# Patient Record
Sex: Female | Born: 1955 | Race: Black or African American | Hispanic: No | Marital: Married | Smoking: Current every day smoker
Health system: Southern US, Community
[De-identification: ages and names within clinical notes are randomized; demographics above are authoritative.]

## PROBLEM LIST (undated history)

## (undated) DIAGNOSIS — F32A Depression, unspecified: Secondary | ICD-10-CM

## (undated) DIAGNOSIS — K219 Gastro-esophageal reflux disease without esophagitis: Secondary | ICD-10-CM

## (undated) DIAGNOSIS — I10 Essential (primary) hypertension: Secondary | ICD-10-CM

## (undated) DIAGNOSIS — M1990 Unspecified osteoarthritis, unspecified site: Secondary | ICD-10-CM

## (undated) DIAGNOSIS — F419 Anxiety disorder, unspecified: Secondary | ICD-10-CM

## (undated) DIAGNOSIS — Z9109 Other allergy status, other than to drugs and biological substances: Secondary | ICD-10-CM

## (undated) DIAGNOSIS — M797 Fibromyalgia: Secondary | ICD-10-CM

## (undated) DIAGNOSIS — M792 Neuralgia and neuritis, unspecified: Secondary | ICD-10-CM

## (undated) DIAGNOSIS — E039 Hypothyroidism, unspecified: Secondary | ICD-10-CM

## (undated) DIAGNOSIS — K449 Diaphragmatic hernia without obstruction or gangrene: Secondary | ICD-10-CM

## (undated) DIAGNOSIS — F329 Major depressive disorder, single episode, unspecified: Secondary | ICD-10-CM

## (undated) DIAGNOSIS — M7918 Myalgia, other site: Secondary | ICD-10-CM

## (undated) DIAGNOSIS — Z87891 Personal history of nicotine dependence: Secondary | ICD-10-CM

## (undated) DIAGNOSIS — Z889 Allergy status to unspecified drugs, medicaments and biological substances status: Secondary | ICD-10-CM

## (undated) DIAGNOSIS — M549 Dorsalgia, unspecified: Secondary | ICD-10-CM

## (undated) DIAGNOSIS — G8929 Other chronic pain: Secondary | ICD-10-CM

## (undated) DIAGNOSIS — K589 Irritable bowel syndrome without diarrhea: Secondary | ICD-10-CM

## (undated) HISTORY — DX: Hypothyroidism, unspecified: E03.9

## (undated) HISTORY — PX: ANKLE SURGERY: SHX546

## (undated) HISTORY — PX: CHOLECYSTECTOMY: SHX55

## (undated) HISTORY — DX: Anxiety disorder, unspecified: F41.9

## (undated) HISTORY — DX: Gastro-esophageal reflux disease without esophagitis: K21.9

## (undated) HISTORY — DX: Diaphragmatic hernia without obstruction or gangrene: K44.9

## (undated) HISTORY — PX: COLONOSCOPY: SHX174

## (undated) HISTORY — DX: Depression, unspecified: F32.A

## (undated) HISTORY — DX: Fibromyalgia: M79.7

## (undated) HISTORY — PX: EYE SURGERY: SHX253

## (undated) HISTORY — PX: HERNIA REPAIR: SHX51

## (undated) HISTORY — DX: Myalgia, other site: M79.18

## (undated) HISTORY — DX: Personal history of nicotine dependence: Z87.891

## (undated) HISTORY — PX: NECK SURGERY: SHX720

## (undated) HISTORY — DX: Irritable bowel syndrome, unspecified: K58.9

## (undated) HISTORY — DX: Unspecified osteoarthritis, unspecified site: M19.90

## (undated) HISTORY — DX: Major depressive disorder, single episode, unspecified: F32.9

## (undated) HISTORY — PX: BACK SURGERY: SHX140

## (undated) HISTORY — DX: Neuralgia and neuritis, unspecified: M79.2

## (undated) HISTORY — PX: ABDOMINAL HYSTERECTOMY: SHX81

---

## 1997-12-08 ENCOUNTER — Ambulatory Visit (HOSPITAL_COMMUNITY): Admission: RE | Admit: 1997-12-08 | Discharge: 1997-12-08 | Payer: Self-pay | Admitting: Neurosurgery

## 1997-12-13 ENCOUNTER — Ambulatory Visit (HOSPITAL_COMMUNITY): Admission: RE | Admit: 1997-12-13 | Discharge: 1997-12-13 | Payer: Self-pay | Admitting: Neurosurgery

## 2001-06-08 ENCOUNTER — Ambulatory Visit (HOSPITAL_COMMUNITY): Admission: RE | Admit: 2001-06-08 | Discharge: 2001-06-08 | Payer: Self-pay | Admitting: Gastroenterology

## 2001-12-02 ENCOUNTER — Inpatient Hospital Stay (HOSPITAL_COMMUNITY): Admission: RE | Admit: 2001-12-02 | Discharge: 2001-12-03 | Payer: Self-pay | Admitting: Neurosurgery

## 2001-12-02 ENCOUNTER — Encounter: Payer: Self-pay | Admitting: Neurosurgery

## 2004-06-12 ENCOUNTER — Ambulatory Visit: Payer: Self-pay | Admitting: Anesthesiology

## 2004-07-08 ENCOUNTER — Ambulatory Visit: Payer: Self-pay | Admitting: Anesthesiology

## 2004-08-22 ENCOUNTER — Ambulatory Visit: Payer: Self-pay | Admitting: Anesthesiology

## 2004-10-07 ENCOUNTER — Ambulatory Visit: Payer: Self-pay | Admitting: Anesthesiology

## 2004-11-12 ENCOUNTER — Ambulatory Visit: Payer: Self-pay | Admitting: Anesthesiology

## 2004-11-19 ENCOUNTER — Other Ambulatory Visit: Payer: Self-pay

## 2004-11-27 ENCOUNTER — Inpatient Hospital Stay: Payer: Self-pay | Admitting: Unknown Physician Specialty

## 2004-12-03 ENCOUNTER — Emergency Department: Payer: Self-pay | Admitting: Unknown Physician Specialty

## 2004-12-04 ENCOUNTER — Emergency Department: Payer: Self-pay | Admitting: Emergency Medicine

## 2004-12-11 ENCOUNTER — Inpatient Hospital Stay: Payer: Self-pay | Admitting: Unknown Physician Specialty

## 2006-04-08 ENCOUNTER — Ambulatory Visit: Payer: Self-pay | Admitting: Physician Assistant

## 2006-07-08 ENCOUNTER — Other Ambulatory Visit: Payer: Self-pay

## 2006-07-15 ENCOUNTER — Inpatient Hospital Stay: Payer: Self-pay | Admitting: Unknown Physician Specialty

## 2007-07-12 ENCOUNTER — Ambulatory Visit: Payer: Self-pay | Admitting: Anesthesiology

## 2007-08-05 ENCOUNTER — Ambulatory Visit: Payer: Self-pay | Admitting: Anesthesiology

## 2007-10-01 ENCOUNTER — Ambulatory Visit: Payer: Self-pay | Admitting: Anesthesiology

## 2007-12-14 ENCOUNTER — Ambulatory Visit: Payer: Self-pay | Admitting: Anesthesiology

## 2008-01-11 ENCOUNTER — Ambulatory Visit: Payer: Self-pay | Admitting: Anesthesiology

## 2008-03-09 ENCOUNTER — Ambulatory Visit: Payer: Self-pay | Admitting: Anesthesiology

## 2008-04-12 ENCOUNTER — Ambulatory Visit: Payer: Self-pay | Admitting: Anesthesiology

## 2008-04-24 ENCOUNTER — Encounter: Payer: Self-pay | Admitting: Anesthesiology

## 2008-05-10 ENCOUNTER — Ambulatory Visit: Payer: Self-pay | Admitting: Anesthesiology

## 2008-06-20 ENCOUNTER — Ambulatory Visit: Payer: Self-pay | Admitting: Physician Assistant

## 2008-07-11 ENCOUNTER — Ambulatory Visit: Payer: Self-pay | Admitting: Anesthesiology

## 2008-07-17 ENCOUNTER — Ambulatory Visit: Payer: Self-pay | Admitting: Unknown Physician Specialty

## 2008-07-24 ENCOUNTER — Inpatient Hospital Stay: Payer: Self-pay | Admitting: Unknown Physician Specialty

## 2009-08-08 ENCOUNTER — Ambulatory Visit: Payer: Self-pay | Admitting: Anesthesiology

## 2009-10-01 ENCOUNTER — Ambulatory Visit: Payer: Self-pay | Admitting: Anesthesiology

## 2009-10-31 ENCOUNTER — Ambulatory Visit: Payer: Self-pay | Admitting: Anesthesiology

## 2009-12-14 ENCOUNTER — Ambulatory Visit: Payer: Self-pay | Admitting: Anesthesiology

## 2009-12-26 ENCOUNTER — Ambulatory Visit: Payer: Self-pay | Admitting: Pain Medicine

## 2010-03-19 ENCOUNTER — Ambulatory Visit: Payer: Self-pay | Admitting: Anesthesiology

## 2010-05-23 ENCOUNTER — Ambulatory Visit: Payer: Self-pay | Admitting: Anesthesiology

## 2010-08-30 ENCOUNTER — Ambulatory Visit: Payer: Self-pay | Admitting: Anesthesiology

## 2010-11-11 ENCOUNTER — Ambulatory Visit: Payer: Self-pay | Admitting: Anesthesiology

## 2010-11-27 ENCOUNTER — Ambulatory Visit: Payer: Self-pay | Admitting: Anesthesiology

## 2011-02-25 ENCOUNTER — Ambulatory Visit: Payer: Self-pay | Admitting: Anesthesiology

## 2011-03-12 ENCOUNTER — Ambulatory Visit: Payer: Self-pay | Admitting: Pain Medicine

## 2011-04-21 ENCOUNTER — Ambulatory Visit: Payer: Self-pay | Admitting: Anesthesiology

## 2011-05-07 ENCOUNTER — Ambulatory Visit: Payer: Self-pay | Admitting: Anesthesiology

## 2011-05-28 ENCOUNTER — Ambulatory Visit: Payer: Self-pay | Admitting: Anesthesiology

## 2011-08-19 ENCOUNTER — Ambulatory Visit: Payer: Self-pay | Admitting: Anesthesiology

## 2011-10-28 ENCOUNTER — Ambulatory Visit: Payer: Self-pay | Admitting: Anesthesiology

## 2011-12-24 ENCOUNTER — Ambulatory Visit: Payer: Self-pay | Admitting: Anesthesiology

## 2012-01-27 ENCOUNTER — Ambulatory Visit: Payer: Self-pay | Admitting: Anesthesiology

## 2012-01-30 ENCOUNTER — Ambulatory Visit: Payer: Self-pay | Admitting: Pain Medicine

## 2012-03-04 ENCOUNTER — Ambulatory Visit: Payer: Self-pay | Admitting: Pain Medicine

## 2012-03-11 ENCOUNTER — Ambulatory Visit: Payer: Self-pay | Admitting: Pain Medicine

## 2012-04-05 ENCOUNTER — Ambulatory Visit: Payer: Self-pay | Admitting: Pain Medicine

## 2012-04-15 ENCOUNTER — Ambulatory Visit: Payer: Self-pay | Admitting: Pain Medicine

## 2012-04-22 ENCOUNTER — Encounter (HOSPITAL_COMMUNITY): Payer: Self-pay | Admitting: Emergency Medicine

## 2012-04-22 ENCOUNTER — Emergency Department (HOSPITAL_COMMUNITY)
Admission: EM | Admit: 2012-04-22 | Discharge: 2012-04-22 | Disposition: A | Discharge status: Home / Self Care | Payer: No Typology Code available for payment source | Attending: Emergency Medicine | Admitting: Emergency Medicine

## 2012-04-22 ENCOUNTER — Emergency Department (HOSPITAL_COMMUNITY): Payer: No Typology Code available for payment source

## 2012-04-22 DIAGNOSIS — I10 Essential (primary) hypertension: Secondary | ICD-10-CM | POA: Insufficient documentation

## 2012-04-22 DIAGNOSIS — M545 Low back pain, unspecified: Secondary | ICD-10-CM | POA: Insufficient documentation

## 2012-04-22 DIAGNOSIS — M79609 Pain in unspecified limb: Secondary | ICD-10-CM | POA: Insufficient documentation

## 2012-04-22 DIAGNOSIS — F17200 Nicotine dependence, unspecified, uncomplicated: Secondary | ICD-10-CM | POA: Insufficient documentation

## 2012-04-22 DIAGNOSIS — R51 Headache: Secondary | ICD-10-CM | POA: Insufficient documentation

## 2012-04-22 DIAGNOSIS — M2550 Pain in unspecified joint: Secondary | ICD-10-CM | POA: Insufficient documentation

## 2012-04-22 DIAGNOSIS — M549 Dorsalgia, unspecified: Secondary | ICD-10-CM

## 2012-04-22 DIAGNOSIS — IMO0001 Reserved for inherently not codable concepts without codable children: Secondary | ICD-10-CM | POA: Insufficient documentation

## 2012-04-22 DIAGNOSIS — M542 Cervicalgia: Secondary | ICD-10-CM | POA: Insufficient documentation

## 2012-04-22 DIAGNOSIS — G8929 Other chronic pain: Secondary | ICD-10-CM | POA: Insufficient documentation

## 2012-04-22 DIAGNOSIS — Z79899 Other long term (current) drug therapy: Secondary | ICD-10-CM | POA: Insufficient documentation

## 2012-04-22 HISTORY — DX: Other chronic pain: G89.29

## 2012-04-22 HISTORY — DX: Essential (primary) hypertension: I10

## 2012-04-22 HISTORY — DX: Dorsalgia, unspecified: M54.9

## 2012-04-22 MED ORDER — LORAZEPAM 1 MG PO TABS
1.0000 mg | ORAL_TABLET | Freq: Once | ORAL | Status: AC
  Administered 2012-04-22: 1 mg via ORAL
  Filled 2012-04-22: qty 1

## 2012-04-22 MED ORDER — KETOROLAC TROMETHAMINE 60 MG/2ML IM SOLN
60.0000 mg | Freq: Once | INTRAMUSCULAR | Status: AC
  Administered 2012-04-22: 60 mg via INTRAMUSCULAR
  Filled 2012-04-22: qty 2

## 2012-04-22 MED ORDER — NAPROXEN 500 MG PO TABS: 500.0000 mg | ORAL_TABLET | Freq: Two times a day (BID) | ORAL | Status: DC

## 2012-04-22 MED ORDER — METHOCARBAMOL 500 MG PO TABS: 500.0000 mg | ORAL_TABLET | Freq: Two times a day (BID) | ORAL | Status: DC

## 2012-04-22 MED ORDER — OXYCODONE-ACETAMINOPHEN 5-325 MG PO TABS: 1.0000 | ORAL_TABLET | ORAL | Status: DC | PRN

## 2012-04-22 MED ORDER — OXYCODONE-ACETAMINOPHEN 5-325 MG PO TABS
2.0000 | ORAL_TABLET | Freq: Once | ORAL | Status: AC
  Administered 2012-04-22: 2 via ORAL
  Filled 2012-04-22: qty 2

## 2012-04-22 NOTE — ED Notes (Signed)
pt ambulated without difficulty.
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
## 2012-04-22 NOTE — ED Provider Notes (Signed)
Medical screening examination/treatment/procedure(s) were performed by non-physician practitioner and as supervising physician I was immediately available for consultation/collaboration.   Jeanmarc Viernes, MD 04/22/12 2334 

## 2012-04-22 NOTE — ED Notes (Signed)
Pt verbalizes understanding 

## 2012-05-10 ENCOUNTER — Ambulatory Visit: Payer: Self-pay | Admitting: Pain Medicine

## 2012-05-21 ENCOUNTER — Ambulatory Visit: Payer: Self-pay | Admitting: Pain Medicine

## 2012-06-30 ENCOUNTER — Ambulatory Visit: Payer: Self-pay | Admitting: Pain Medicine

## 2012-08-17 ENCOUNTER — Ambulatory Visit: Payer: Self-pay | Admitting: Pain Medicine

## 2012-10-25 ENCOUNTER — Ambulatory Visit: Payer: Self-pay | Admitting: Pain Medicine

## 2012-11-02 ENCOUNTER — Ambulatory Visit: Payer: Self-pay | Admitting: Pain Medicine

## 2012-11-17 ENCOUNTER — Ambulatory Visit: Payer: Self-pay | Admitting: Pain Medicine

## 2013-02-01 ENCOUNTER — Ambulatory Visit: Payer: Self-pay | Admitting: Pain Medicine

## 2013-04-19 ENCOUNTER — Ambulatory Visit: Payer: Self-pay | Admitting: Pain Medicine

## 2013-05-19 ENCOUNTER — Ambulatory Visit: Payer: Self-pay | Admitting: Pain Medicine

## 2013-05-31 ENCOUNTER — Ambulatory Visit: Payer: Self-pay | Admitting: Pain Medicine

## 2013-07-06 ENCOUNTER — Ambulatory Visit: Payer: Self-pay | Admitting: Pain Medicine

## 2013-09-27 ENCOUNTER — Ambulatory Visit: Payer: Self-pay | Admitting: Pain Medicine

## 2013-11-14 ENCOUNTER — Ambulatory Visit: Payer: Self-pay | Admitting: Pain Medicine

## 2013-12-13 ENCOUNTER — Ambulatory Visit: Payer: Self-pay | Admitting: Pain Medicine

## 2014-01-31 ENCOUNTER — Ambulatory Visit: Payer: Self-pay | Admitting: Pain Medicine

## 2014-02-10 ENCOUNTER — Ambulatory Visit: Payer: Self-pay | Admitting: Pain Medicine

## 2014-04-18 ENCOUNTER — Ambulatory Visit: Payer: Self-pay | Admitting: Pain Medicine

## 2014-06-09 ENCOUNTER — Ambulatory Visit: Payer: Self-pay | Admitting: Pain Medicine

## 2014-09-08 ENCOUNTER — Ambulatory Visit: Payer: Self-pay | Admitting: Pain Medicine

## 2014-11-07 NOTE — Op Note (Signed)
PATIENT NAME:  Lydia Hernandez, Lydia Hernandez MR#:  [id number] DATE OF BIRTH:  1956/04/24  DATE OF PROCEDURE:  03/04/2012  Location: Special Procedures Room in the Pain Facility  Referring Physician: Dr. Vashti Hey Procedure by: Kathlen Brunswick. Dossie Arbour, MD  NOTE: This is the case of a 59 year old African-American female patient who comes into the clinic today for a bilateral lumbar spinal cord stimulator trial implant under fluoroscopic guidance and IV sedation. 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 Prior to the surgery I sat down with her for a long time explaining to her how the procedure would be conducted as well as what to expect at every step. The patient was informed of all the risks and possible complications. The patient was initially under the impression that we were going to put her to sleep to do the trial but we clarified that was not the case and we explained in detail why.   Procedure(s):  1. Temporary (Trial) implantation of Lumbar Epidural, Double Percutaneous Neurostimulator Leads (16 electrode array). 2. Fluoroscopic Needle Guidance 3. Intraoperative Analysis and Programming. 4. Postoperative Analysis and Programming. 5. Moderate Conscious Sedation  Surgeon: Eliakim Tendler A. Dossie Arbour, M.D. Side of implant: Bilateral Top electrode tip level: Top of the T8 vertebral body on the left side for the compact Medtronic 3874 lead and T8-T9 intervertebral space on the right side for the Medtronic 3875 subcompact lead.  Diagnostic Indications: Chronic Lumbosacral Radiculopathy/Radiculitis. Position: Prone.  Prepping solution: DuraPrep Area prepped: The thoracolumbar and sacral areas, were prepped with a broad-spectrum topical antiseptic microbicide. Target area: Lumbar Epidural Space, around the T8-9 vertebral body, for the electrode tip. Insertion site is the T12-L1 intervertebral space. Level entered:  T12-L1. Number of attempts: one  Infection Control: Standard Universal Precautions taken (Respiratory Hygiene/Cough Etiquette; Mouth, nose, eye protection; Hand Hygiene; Personal protective equipment (PPE); safe injection practices; and use of masks and disposable sterile surgical gloves) as recommended by the Department of [place] for Disease Control and Prevention (CDC).  Safety Measures: Allergies were reviewed. Appropriate site, procedure, and patient were confirmed by following the Joint Commission's Universal Protocol (UP.01.01.01). The patient was asked to confirm marked site and procedure, before commencing. The patient was asked about blood thinners, or active infections, both of which were denied. No attempt was made at seeking any paresthesias. Aspiration looking for blood return was conducted prior to injecting. At no point did we inject any substances, as a needle was being advanced.  Pre-procedure Assessment:  A medical history and physical exam were obtained. Relevant documentation was reviewed and verified. Prior to the procedure, the patient was provided with an Audio CD, as well as written information on the procedure, including side-effects, and possible complications. Under the influence of no sedatives, a verbal, as well as a written informed consent were obtained, after having provided information on the risks and possible complications. To fulfill our ethical and legal obligations, as recommended by the American Medical Association's Code of Ethics, we have provided information to the patient about our clinical impression; the nature and purpose of an available treatment or procedure; the risks and benefits of an available treatment or procedure; alternatives; the risk and benefits of the alternative treatment or procedure; and the risks and benefits of not receiving or undergoing a treatment or procedure. The patient was provided information about the risks and possible  complications associated with the procedure. These include, but not limited to, failure  to achieve desired goals, infection, bleeding, organ or nerve damage, allergic reactions, paralysis, and death. In addition, the patient was informed that Medicine is not an exact science; therefore, there is also the possibility of unforeseen risks and possible complications that may result in a catastrophic outcome. The patient indicated having understood very clearly.  We have given the patient no guarantees and we have made no promises. Ample time was given to the patient to ask questions, all of which were answered, to the patient's satisfaction, before proceeding. The patient understands that by signing our informed consent form, they understand and accept the risks and the fact that it is impossible to predict all possible complications. Baseline vital signs were taken and the medical assessment was completed. Verification of the correct person, correct site (including marking of site), and correct procedure were performed and confirmed by the patient. Baseline vital signs were taken and the initial assessment was completed. Verification of the correct person, correct site (including marking of site), and correct procedure were performed and confirmed by the patient, in the form of a "Time Out".  Monitoring: The patient was monitored in the usual manner, using NIBPM, ECG, and pulse oximetry.  IV Access:  An IV access was obtained and secured.  Analgesia:  Moderate (Conscious) Intravenous sedation: Consent was obtained before administering any sedation. Availability of a responsible, adult driver, and NPO status confirmed. Meaningful verbal contact was maintained, with the patient at all times during the procedure. ASA Sedation Guidelines followed. For specifics on pharmacological type and quantity of sedation, please see nursing chart.  Prophylactic Antibiotics: Cefazolin (1st generation cephalosporin) 1 gm IVPB  30 minutes prior to procedure.  Local Anesthesia: Lidocaine 1%. The skin over the procedure site were infiltrated using a 3 ml Luer-Lok syringe with a 0.5 inch, 25-G needle. Deeper tissues were infiltrated using a 3.0 inch, 22-G spinal needle, under fluoroscopic guidance.  Fluoroscopy: The patient was taken to the operative suite, where the patient was placed in position for the procedure, over the fluoroscopy compatible table. Fluoroscopy was manipulated, using "Tunnel Vision Technique", to obtain the best possible view of the target area, on the affected side. Parallax error was corrected before commencing the procedure. Gabor Racz's "Direction-Depth-Direction" technique was used to introduce the procedural needle under continuous pulsed fluoroscopic guidance. Once the target was reached, antero-posterior and lateral fluoroscopic views were taken to confirm needle placement in two planes. Fluoroscopy time: Please see the patient's chart for details.  Description of the procedure: The procedure site was prepped using a broad-spectrum topical antiseptic. The area was then draped in the usual and standard manner. "Time-out" was performed as per JC Universal Protocol (UP.01.01.01).   The skin and deeper tissues over the procedure site were infiltrated using 1% lidocaine, loaded in a 10 ml Luer-Lok syringe with a 0.5 inch, 25-G needle. The procedural needle was then introduced through the skin and deeper tissues, using Gabor Racz's "Direction-Depth-Direction" technique, under pulsed fluoroscopic guidance. No attempt was made at seeking a paresthesia. The paramidline approach was used to enter the posterior lumbar epidural space at a 30 degree angle, using "Loss-of-resistance Technique" with 3 ml of PF-NaCl (0.9% NSS) + 0.5 ml of air, in a 5 ml glass syringe, using a "loss-of-bounce technique", at the desired level. Correct needle placement was confirmed in the  antero-posterior and lateral fluoroscopic views.  The epidural lead was gently introduced under real-time fluoroscopy, constantly assessing for pain or paresthesias, until the tip was observed to be at the  target level, on the side ipsilateral to the pain. The placement of the second lead was accomplished in a similar fashion. Once the target was thought to have been reached, antero-posterior and lateral fluoroscopic views were taken to confirm electrode placement in two planes. Placement was tested until a comfortable stimulation pattern was observed over the usual painful area. Once the patient had assured [place] that the stimulation was in the correct pattern, and distribution, we proceeded to remove the 15-G "Tuohy" epidural needle(s). This was done while observing the electrode tip(s) under real-time fluoroscopy to prevent movement. The leads were then fixed to the skin using silk 2-0 suture. Benzoin was applied to the area, and 6 (six) 79M, 1/2"X4", reinforced, adhesive Steri Strips were used to further secure the lead into place. A sterile transparent dressing was then used to cover the lead, in order to assess any evidence of infection in the future.  The patient tolerated the entire procedure well. A repeat set of vitals were taken after the procedure and the patient was kept under observation until discharge criteria was met. The patient was provided with discharge instructions, including a section on how to identify potential problems. Should any problems arise concerning this procedure, the patient was given instructions to immediately contact us, without hesitation. The neurostimulator representative and I, both provided the patient with our Business cards containing our contact telephone numbers, and instructed the patient to contact either one of [place], at any time, should there be any problems or questions. In any case, we plan to contact the patient by telephone for a follow-up status report regarding this interventional procedure.  EBL: 5  ml  Complications: No heme; no paresthesias.  Disposition: Return to clinics in 5-7 days for removal of trial electrodes and evaluation of trial.  Additional Comments/Plan: None.  Equipment used:   1. Medtronic subcompact lead model [id number], lot #[id number]. 2. Medtronic lead model [id number], lot #[id number] compact lead on the left side.   NOTE: With the above setup and leads, we were able to capture the area of the lower back and both lower extremities where the patient was experiencing her pain. Of note, is the fact that the patient demonstrated having very poor pain tolerance as she actually screamed when we were simply infiltrating local anesthetic into her skin. Because of this, I made sure to numb the area as well as I could in order to provide her with the most amount of comfort possible.   Disclaimer: Medicine is not an Chief Strategy Officer. The only guarantee in medicine is that nothing is guaranteed. It is important to note that the decision to proceed with this intervention was based on the information collected from the patient. The Data and conclusions were drawn from the patient's questionnaire, the interview, and the physical examination. Because the information was provided in large part by the patient, it cannot be guaranteed that it has not been purposely or unconsciously manipulated. Every effort has been made to obtain as much relevant data as possible for this evaluation. It is important to note that the conclusions that lead to this procedure are derived in large part from the available data. Always take into account that the treatment will also be dependent on availability of resources and existing treatment guidelines, considered by other Pain Management Practitioners as being common knowledge and practice, at this time. For Medico-Legal purposes, it is also important to point out that variations in procedural techniques and pharmacological choices are the acceptable norm. The indications,  contraindications, technique, and results of the above procedure should only be interpreted and judged by a Board-Certified Interventional Pain Specialist with extensive familiarity and expertise in the same exact procedure and technique, doing otherwise would be inappropriate and unethical.  ____________________________ Kathlen Brunswick. Dossie Arbour, MD fan:cms D: 03/04/2012 15:59:10 ET T: 03/04/2012 17:50:53 ET JOB#: [id number]  cc: Marin Wisner A. Dossie Arbour, MD, <Dictator> Gaspar Cola MD ELECTRONICALLY SIGNED 03/10/2012 10:40

## 2015-05-01 ENCOUNTER — Telehealth: Payer: Self-pay | Admitting: Pain Medicine

## 2015-05-01 NOTE — Telephone Encounter (Signed)
Patient will be out of meds before appt date due to pharmacy says one script out of date and would not fill

## 2015-05-22 ENCOUNTER — Other Ambulatory Visit: Payer: Self-pay | Admitting: Pain Medicine

## 2015-05-22 ENCOUNTER — Ambulatory Visit: Payer: Medicare Other | Attending: Pain Medicine | Admitting: Pain Medicine

## 2015-05-22 ENCOUNTER — Encounter: Payer: Self-pay | Admitting: Pain Medicine

## 2015-05-22 VITALS — BP 134/102 | HR 79 | Temp 98.2°F | Resp 18 | Ht 65.0 in | Wt 202.0 lb

## 2015-05-22 DIAGNOSIS — M79606 Pain in leg, unspecified: Secondary | ICD-10-CM

## 2015-05-22 DIAGNOSIS — M549 Dorsalgia, unspecified: Secondary | ICD-10-CM | POA: Diagnosis present

## 2015-05-22 DIAGNOSIS — M47896 Other spondylosis, lumbar region: Secondary | ICD-10-CM

## 2015-05-22 DIAGNOSIS — E538 Deficiency of other specified B group vitamins: Secondary | ICD-10-CM

## 2015-05-22 DIAGNOSIS — Z87891 Personal history of nicotine dependence: Secondary | ICD-10-CM

## 2015-05-22 DIAGNOSIS — F329 Major depressive disorder, single episode, unspecified: Secondary | ICD-10-CM | POA: Diagnosis not present

## 2015-05-22 DIAGNOSIS — E559 Vitamin D deficiency, unspecified: Secondary | ICD-10-CM

## 2015-05-22 DIAGNOSIS — M545 Low back pain: Secondary | ICD-10-CM | POA: Diagnosis not present

## 2015-05-22 DIAGNOSIS — M791 Myalgia: Secondary | ICD-10-CM

## 2015-05-22 DIAGNOSIS — G9619 Other disorders of meninges, not elsewhere classified: Secondary | ICD-10-CM

## 2015-05-22 DIAGNOSIS — D1779 Benign lipomatous neoplasm of other sites: Secondary | ICD-10-CM

## 2015-05-22 DIAGNOSIS — M961 Postlaminectomy syndrome, not elsewhere classified: Secondary | ICD-10-CM

## 2015-05-22 DIAGNOSIS — F32A Depression, unspecified: Secondary | ICD-10-CM

## 2015-05-22 DIAGNOSIS — T402X5A Adverse effect of other opioids, initial encounter: Secondary | ICD-10-CM

## 2015-05-22 DIAGNOSIS — Z79899 Other long term (current) drug therapy: Secondary | ICD-10-CM

## 2015-05-22 DIAGNOSIS — K589 Irritable bowel syndrome without diarrhea: Secondary | ICD-10-CM | POA: Insufficient documentation

## 2015-05-22 DIAGNOSIS — M542 Cervicalgia: Secondary | ICD-10-CM

## 2015-05-22 DIAGNOSIS — F419 Anxiety disorder, unspecified: Secondary | ICD-10-CM | POA: Diagnosis not present

## 2015-05-22 DIAGNOSIS — G894 Chronic pain syndrome: Secondary | ICD-10-CM

## 2015-05-22 DIAGNOSIS — F1190 Opioid use, unspecified, uncomplicated: Secondary | ICD-10-CM | POA: Insufficient documentation

## 2015-05-22 DIAGNOSIS — M539 Dorsopathy, unspecified: Secondary | ICD-10-CM

## 2015-05-22 DIAGNOSIS — M5416 Radiculopathy, lumbar region: Secondary | ICD-10-CM

## 2015-05-22 DIAGNOSIS — Z79891 Long term (current) use of opiate analgesic: Secondary | ICD-10-CM

## 2015-05-22 DIAGNOSIS — M79605 Pain in left leg: Secondary | ICD-10-CM | POA: Diagnosis present

## 2015-05-22 DIAGNOSIS — K219 Gastro-esophageal reflux disease without esophagitis: Secondary | ICD-10-CM | POA: Insufficient documentation

## 2015-05-22 DIAGNOSIS — M797 Fibromyalgia: Secondary | ICD-10-CM | POA: Diagnosis not present

## 2015-05-22 DIAGNOSIS — F1120 Opioid dependence, uncomplicated: Secondary | ICD-10-CM

## 2015-05-22 DIAGNOSIS — K449 Diaphragmatic hernia without obstruction or gangrene: Secondary | ICD-10-CM

## 2015-05-22 DIAGNOSIS — K5900 Constipation, unspecified: Secondary | ICD-10-CM

## 2015-05-22 DIAGNOSIS — M5442 Lumbago with sciatica, left side: Secondary | ICD-10-CM

## 2015-05-22 DIAGNOSIS — E039 Hypothyroidism, unspecified: Secondary | ICD-10-CM | POA: Insufficient documentation

## 2015-05-22 DIAGNOSIS — G8929 Other chronic pain: Secondary | ICD-10-CM

## 2015-05-22 DIAGNOSIS — Z5181 Encounter for therapeutic drug level monitoring: Secondary | ICD-10-CM | POA: Insufficient documentation

## 2015-05-22 DIAGNOSIS — M810 Age-related osteoporosis without current pathological fracture: Secondary | ICD-10-CM

## 2015-05-22 DIAGNOSIS — I10 Essential (primary) hypertension: Secondary | ICD-10-CM | POA: Insufficient documentation

## 2015-05-22 DIAGNOSIS — M4802 Spinal stenosis, cervical region: Secondary | ICD-10-CM

## 2015-05-22 DIAGNOSIS — M47812 Spondylosis without myelopathy or radiculopathy, cervical region: Secondary | ICD-10-CM

## 2015-05-22 DIAGNOSIS — M4726 Other spondylosis with radiculopathy, lumbar region: Secondary | ICD-10-CM

## 2015-05-22 DIAGNOSIS — M792 Neuralgia and neuritis, unspecified: Secondary | ICD-10-CM

## 2015-05-22 DIAGNOSIS — M79604 Pain in right leg: Secondary | ICD-10-CM | POA: Diagnosis present

## 2015-05-22 DIAGNOSIS — K5909 Other constipation: Secondary | ICD-10-CM

## 2015-05-22 DIAGNOSIS — M47816 Spondylosis without myelopathy or radiculopathy, lumbar region: Secondary | ICD-10-CM

## 2015-05-22 DIAGNOSIS — M7918 Myalgia, other site: Secondary | ICD-10-CM

## 2015-05-22 DIAGNOSIS — G96198 Other disorders of meninges, not elsewhere classified: Secondary | ICD-10-CM

## 2015-05-22 DIAGNOSIS — F411 Generalized anxiety disorder: Secondary | ICD-10-CM

## 2015-05-22 DIAGNOSIS — M533 Sacrococcygeal disorders, not elsewhere classified: Secondary | ICD-10-CM

## 2015-05-22 DIAGNOSIS — K5903 Drug induced constipation: Secondary | ICD-10-CM

## 2015-05-22 HISTORY — DX: Other chronic pain: G89.29

## 2015-05-22 HISTORY — DX: Long term (current) use of opiate analgesic: Z79.891

## 2015-05-22 HISTORY — DX: Encounter for therapeutic drug level monitoring: Z51.81

## 2015-05-22 HISTORY — DX: Opioid use, unspecified, uncomplicated: F11.90

## 2015-05-22 HISTORY — DX: Chronic pain syndrome: G89.4

## 2015-05-22 HISTORY — DX: Postlaminectomy syndrome, not elsewhere classified: M96.1

## 2015-05-22 HISTORY — DX: Opioid dependence, uncomplicated: F11.20

## 2015-05-22 MED ORDER — HYDROCODONE-ACETAMINOPHEN 7.5-325 MG PO TABS: 1.0000 | ORAL_TABLET | Freq: Three times a day (TID) | ORAL | Status: DC | PRN

## 2015-05-22 MED ORDER — GABAPENTIN 300 MG PO CAPS: 300.0000 mg | ORAL_CAPSULE | Freq: Four times a day (QID) | ORAL | Status: DC

## 2015-05-22 MED ORDER — FENTANYL 75 MCG/HR TD PT72: 75.0000 ug | MEDICATED_PATCH | TRANSDERMAL | Status: DC

## 2015-05-22 MED ORDER — MELOXICAM 15 MG PO TABS: 15.0000 mg | ORAL_TABLET | Freq: Every day | ORAL | Status: DC

## 2015-05-22 NOTE — Progress Notes (Signed)
Safety precautions to be maintained throughout the outpatient stay will include: orient to surroundings, keep bed in low position, maintain call bell within reach at all times, provide assistance with transfer out of bed and ambulation. Did not bring pills for pill count.  Reminded to bring pills to each appointment.

## 2015-05-22 NOTE — Patient Instructions (Addendum)
Smoking Cessation, Tips for Success If you are ready to quit smoking, congratulations! You have chosen to help yourself be healthier. Cigarettes bring nicotine, tar, carbon monoxide, and other irritants into your body. Your lungs, heart, and blood vessels will be able to work better without these poisons. There are many different ways to quit smoking. Nicotine gum, nicotine patches, a nicotine inhaler, or nicotine nasal spray can help with physical craving. Hypnosis, support groups, and medicines help break the habit of smoking. WHAT THINGS CAN I DO TO MAKE QUITTING EASIER?  Here are some tips to help you quit for good: 1. Pick a date when you will quit smoking completely. Tell all of your friends and family about your plan to quit on that date. 2. Do not try to slowly cut down on the number of cigarettes you are smoking. Pick a quit date and quit smoking completely starting on that day. 3. Throw away all cigarettes.  4. Clean and remove all ashtrays from your home, work, and car. 5. On a card, write down your reasons for quitting. Carry the card with you and read it when you get the urge to smoke. 6. Cleanse your body of nicotine. Drink enough water and fluids to keep your urine clear or pale yellow. Do this after quitting to flush the nicotine from your body. 7. Learn to predict your moods. Do not let a bad situation be your excuse to have a cigarette. Some situations in your life might tempt you into wanting a cigarette. 8. Never have "just one" cigarette. It leads to wanting another and another. Remind yourself of your decision to quit. 9. Change habits associated with smoking. If you smoked while driving or when feeling stressed, try other activities to replace smoking. Stand up when drinking your coffee. Brush your teeth after eating. Sit in a different chair when you read the paper. Avoid alcohol while trying to quit, and try to drink fewer caffeinated beverages. Alcohol and caffeine may urge you  to smoke. 10. Avoid foods and drinks that can trigger a desire to smoke, such as sugary or spicy foods and alcohol. 11. Ask people who smoke not to smoke around you. 12. Have something planned to do right after eating or having a cup of coffee. For example, plan to take a walk or exercise. 13. Try a relaxation exercise to calm you down and decrease your stress. Remember, you may be tense and nervous for the first 2 weeks after you quit, but this will pass. 14. Find new activities to keep your hands busy. Play with a pen, coin, or rubber band. Doodle or draw things on paper. 15. Brush your teeth right after eating. This will help cut down on the craving for the taste of tobacco after meals. You can also try mouthwash.  16. Use oral substitutes in place of cigarettes. Try using lemon drops, carrots, cinnamon sticks, or chewing gum. Keep them handy so they are available when you have the urge to smoke. 17. When you have the urge to smoke, try deep breathing. 18. Designate your home as a nonsmoking area. 19. If you are a heavy smoker, ask your health care provider about a prescription for nicotine chewing gum. It can ease your withdrawal from nicotine. 20. Reward yourself. Set aside the cigarette money you save and buy yourself something nice. 21. Look for support from others. Join a support group or smoking cessation program. Ask someone at home or at work to help you with your plan   to quit smoking. 22. Always ask yourself, "Do I need this cigarette or is this just a reflex?" Tell yourself, "Today, I choose not to smoke," or "I do not want to smoke." You are reminding yourself of your decision to quit. 23. Do not replace cigarette smoking with electronic cigarettes (commonly called e-cigarettes). The safety of e-cigarettes is unknown, and some may contain harmful chemicals. 24. If you relapse, do not give up! Plan ahead and think about what you will do the next time you get the urge to smoke. HOW WILL  I FEEL WHEN I QUIT SMOKING? You may have symptoms of withdrawal because your body is used to nicotine (the addictive substance in cigarettes). You may crave cigarettes, be irritable, feel very hungry, cough often, get headaches, or have difficulty concentrating. The withdrawal symptoms are only temporary. They are strongest when you first quit but will go away within 10-14 days. When withdrawal symptoms occur, stay in control. Think about your reasons for quitting. Remind yourself that these are signs that your body is healing and getting used to being without cigarettes. Remember that withdrawal symptoms are easier to treat than the major diseases that smoking can cause.  Even after the withdrawal is over, expect periodic urges to smoke. However, these cravings are generally short lived and will go away whether you smoke or not. Do not smoke! WHAT RESOURCES ARE AVAILABLE TO HELP ME QUIT SMOKING? Your health care provider can direct you to community resources or hospitals for support, which may include: 1. Group support. 2. Education. 3. Hypnosis. 4. Therapy.   This information is not intended to replace advice given to you by your health care provider. Make sure you discuss any questions you have with your health care provider.   Document Released: 04/04/2004 Document Revised: 07/28/2014 Document Reviewed: 12/23/2012 Elsevier Interactive Patient Education 2016 [employer] GENERAL RISKS AND COMPLICATIONS  What are the risk, side effects and possible complications? Generally speaking, most procedures are safe.  However, with any procedure there are risks, side effects, and the possibility of complications.  The risks and complications are dependent upon the sites that are lesioned, or the type of nerve block to be performed.  The closer the procedure is to the spine, the more serious the risks are.  Great care is taken when placing the radio frequency needles, block needles or lesioning probes,  but sometimes complications can occur. 1. Infection: Any time there is an injection through the skin, there is a risk of infection.  This is why sterile conditions are used for these blocks.  There are four possible types of infection. 1. Localized skin infection. 2. Central Nervous System Infection-This can be in the form of Meningitis, which can be deadly. 3. Epidural Infections-This can be in the form of an epidural abscess, which can cause pressure inside of the spine, causing compression of the spinal cord with subsequent paralysis. This would require an emergency surgery to decompress, and there are no guarantees that the patient would recover from the paralysis. 4. Discitis-This is an infection of the intervertebral discs.  It occurs in about 1% of discography procedures.  It is difficult to treat and it may lead to surgery.        2. Pain: the needles have to go through skin and soft tissues, will cause soreness.       3. Damage to internal structures:  The nerves to be lesioned may be near blood vessels or    other nerves which can   be potentially damaged.       4. Bleeding: Bleeding is more common if the patient is taking blood thinners such as  aspirin, Coumadin, Ticiid, Plavix, etc., or if he/she have some genetic predisposition  such as hemophilia. Bleeding into the spinal canal can cause compression of the spinal  cord with subsequent paralysis.  This would require an emergency surgery to  decompress and there are no guarantees that the patient would recover from the  paralysis.       5. Pneumothorax:  Puncturing of a lung is a possibility, every time a needle is introduced in  the area of the chest or upper back.  Pneumothorax refers to free air around the  collapsed lung(s), inside of the thoracic cavity (chest cavity).  Another two possible  complications related to a similar event would include: Hemothorax and Chylothorax.   These are variations of the Pneumothorax, where instead of air  around the collapsed  lung(s), you may have blood or chyle, respectively.       6. Spinal headaches: They may occur with any procedures in the area of the spine.       7. Persistent CSF (Cerebro-Spinal Fluid) leakage: This is a rare problem, but may occur  with prolonged intrathecal or epidural catheters either due to the formation of a fistulous  track or a dural tear.       8. Nerve damage: By working so close to the spinal cord, there is always a possibility of  nerve damage, which could be as serious as a permanent spinal cord injury with  paralysis.       9. Death:  Although rare, severe deadly allergic reactions known as "Anaphylactic  reaction" can occur to any of the medications used.      10. Worsening of the symptoms:  We can always make thing worse.  What are the chances of something like this happening? Chances of any of this occuring are extremely low.  By statistics, you have more of a chance of getting killed in a motor vehicle accident: while driving to the hospital than any of the above occurring .  Nevertheless, you should be aware that they are possibilities.  In general, it is similar to taking a shower.  Everybody knows that you can slip, hit your head and get killed.  Does that mean that you should not shower again?  Nevertheless always keep in mind that statistics do not mean anything if you happen to be on the wrong side of them.  Even if a procedure has a 1 (one) in a 1,000,000 (million) chance of going wrong, it you happen to be that one..Also, keep in mind that by statistics, you have more of a chance of having something go wrong when taking medications.  Who should not have this procedure? If you are on a blood thinning medication (e.g. Coumadin, Plavix, see list of "Blood Thinners"), or if you have an active infection going on, you should not have the procedure.  If you are taking any blood thinners, please inform your physician.  How should I prepare for this  procedure?  Do not eat or drink anything at least six hours prior to the procedure.  Bring a driver with you .  It cannot be a taxi.  Come accompanied by an adult that can drive you back, and that is strong enough to help you if your legs get weak or numb from the local anesthetic.  Take all of your medicines   the morning of the procedure with just enough water to swallow them.  If you have diabetes, make sure that you are scheduled to have your procedure done first thing in the morning, whenever possible.  If you have diabetes, take only half of your insulin dose and notify our nurse that you have done so as soon as you arrive at the clinic.  If you are diabetic, but only take blood sugar pills (oral hypoglycemic), then do not take them on the morning of your procedure.  You may take them after you have had the procedure.  Do not take aspirin or any aspirin-containing medications, at least eleven (11) days prior to the procedure.  They may prolong bleeding.  Wear loose fitting clothing that may be easy to take off and that you would not mind if it got stained with Betadine or blood.  Do not wear any jewelry or perfume  Remove any nail coloring.  It will interfere with some of our monitoring equipment.  NOTE: Remember that this is not meant to be interpreted as a complete list of all possible complications.  Unforeseen problems may occur.  BLOOD THINNERS The following drugs contain aspirin or other products, which can cause increased bleeding during surgery and should not be taken for 2 weeks prior to and 1 week after surgery.  If you should need take something for relief of minor pain, you may take acetaminophen which is found in Tylenol,m Datril, Anacin-3 and Panadol. It is not blood thinner. The products listed below are.  Do not take any of the products listed below in addition to any listed on your instruction sheet.  A.P.C or A.P.C with Codeine Codeine Phosphate Capsules #3  Ibuprofen Ridaura  ABC compound Congesprin Imuran rimadil  Advil Cope Indocin Robaxisal  Alka-Seltzer Effervescent Pain Reliever and Antacid Coricidin or Coricidin-D  Indomethacin Rufen  Alka-Seltzer plus Cold Medicine Cosprin Ketoprofen S-A-C Tablets  Anacin Analgesic Tablets or Capsules Coumadin Korlgesic Salflex  Anacin Extra Strength Analgesic tablets or capsules CP-2 Tablets Lanoril Salicylate  Anaprox Cuprimine Capsules Levenox Salocol  Anexsia-D Dalteparin Magan Salsalate  Anodynos Darvon compound Magnesium Salicylate Sine-off  Ansaid Dasin Capsules Magsal Sodium Salicylate  Anturane Depen Capsules Marnal Soma  APF Arthritis pain formula Dewitt's Pills Measurin Stanback  Argesic Dia-Gesic Meclofenamic Sulfinpyrazone  Arthritis Bayer Timed Release Aspirin Diclofenac Meclomen Sulindac  Arthritis pain formula Anacin Dicumarol Medipren Supac  Analgesic (Safety coated) Arthralgen Diffunasal Mefanamic Suprofen  Arthritis Strength Bufferin Dihydrocodeine Mepro Compound Suprol  Arthropan liquid Dopirydamole Methcarbomol with Aspirin Synalgos  ASA tablets/Enseals Disalcid Micrainin Tagament  Ascriptin Doan's Midol Talwin  Ascriptin A/D Dolene Mobidin Tanderil  Ascriptin Extra Strength Dolobid Moblgesic Ticlid  Ascriptin with Codeine Doloprin or Doloprin with Codeine Momentum Tolectin  Asperbuf Duoprin Mono-gesic Trendar  Aspergum Duradyne Motrin or Motrin IB Triminicin  Aspirin plain, buffered or enteric coated Durasal Myochrisine Trigesic  Aspirin Suppositories Easprin Nalfon Trillsate  Aspirin with Codeine Ecotrin Regular or Extra Strength Naprosyn Uracel  Atromid-S Efficin Naproxen Ursinus  Auranofin Capsules Elmiron Neocylate Vanquish  Axotal Emagrin Norgesic Verin  Azathioprine Empirin or Empirin with Codeine Normiflo Vitamin E  Azolid Emprazil Nuprin Voltaren  Bayer Aspirin plain, buffered or children's or timed BC Tablets or powders Encaprin Orgaran Warfarin Sodium   Buff-a-Comp Enoxaparin Orudis Zorpin  Buff-a-Comp with Codeine Equegesic Os-Cal-Gesic   Buffaprin Excedrin plain, buffered or Extra Strength Oxalid   Bufferin Arthritis Strength Feldene Oxphenbutazone   Bufferin plain or Extra Strength Feldene Capsules Oxycodone with Aspirin     Bufferin with Codeine Fenoprofen Fenoprofen Pabalate or Pabalate-SF   Buffets II Flogesic Panagesic   Buffinol plain or Extra Strength Florinal or Florinal with Codeine Panwarfarin   Buf-Tabs Flurbiprofen Penicillamine   Butalbital Compound Four-way cold tablets Penicillin   Butazolidin Fragmin Pepto-Bismol   Carbenicillin Geminisyn Percodan   Carna Arthritis Reliever Geopen Persantine   Carprofen Gold's salt Persistin   Chloramphenicol Goody's Phenylbutazone   Chloromycetin Haltrain Piroxlcam   Clmetidine heparin Plaquenil   Cllnoril Hyco-pap Ponstel   Clofibrate Hydroxy chloroquine Propoxyphen         Before stopping any of these medications, be sure to consult the physician who ordered them.  Some, such as Coumadin (Warfarin) are ordered to prevent or treat serious conditions such as "deep thrombosis", "pumonary embolisms", and other heart problems.  The amount of time that you may need off of the medication may also vary with the medication and the reason for which you were taking it.  If you are taking any of these medications, please make sure you notify your pain physician before you undergo any procedures.         Epidural Steroid Injection Patient Information  Description: The epidural space surrounds the nerves as they exit the spinal cord.  In some patients, the nerves can be compressed and inflamed by a bulging disc or a tight spinal canal (spinal stenosis).  By injecting steroids into the epidural space, we can bring irritated nerves into direct contact with a potentially helpful medication.  These steroids act directly on the irritated nerves and can reduce swelling and inflammation which often  leads to decreased pain.  Epidural steroids may be injected anywhere along the spine and from the neck to the low back depending upon the location of your pain.   After numbing the skin with local anesthetic (like Novocaine), a small needle is passed into the epidural space slowly.  You may experience a sensation of pressure while this is being done.  The entire block usually last less than 10 minutes.  Conditions which may be treated by epidural steroids:   Low back and leg pain  Neck and arm pain  Spinal stenosis  Post-laminectomy syndrome  Herpes zoster (shingles) pain  Pain from compression fractures  Preparation for the injection:  5. Do not eat any solid food or dairy products within 6 hours of your appointment.  6. You may drink clear liquids up to 2 hours before appointment.  Clear liquids include water, black coffee, juice or soda.  No milk or cream please. 7. You may take your regular medication, including pain medications, with a sip of water before your appointment  Diabetics should hold regular insulin (if taken separately) and take 1/2 normal NPH dos the morning of the procedure.  Carry some sugar containing items with you to your appointment. 8. A driver must accompany you and be prepared to drive you home after your procedure.  9. Bring all your current medications with your. 10. An IV may be inserted and sedation may be given at the discretion of the physician.   11. A blood pressure cuff, EKG and other monitors will often be applied during the procedure.  Some patients may need to have extra oxygen administered for a short period. 12. You will be asked to provide medical information, including your allergies, prior to the procedure.  We must know immediately if you are taking blood thinners (like Coumadin/Warfarin)  Or if you are allergic to IV iodine contrast (dye). We must   know if you could possible be pregnant.  Possible side-effects:  Bleeding from needle  site  Infection (rare, may require surgery)  Nerve injury (rare)  Numbness & tingling (temporary)  Difficulty urinating (rare, temporary)  Spinal headache ( a headache worse with upright posture)  Light -headedness (temporary)  Pain at injection site (several days)  Decreased blood pressure (temporary)  Weakness in arm/leg (temporary)  Pressure sensation in back/neck (temporary)  Call if you experience:  Fever/chills associated with headache or increased back/neck pain.  Headache worsened by an upright position.  New onset weakness or numbness of an extremity below the injection site  Hives or difficulty breathing (go to the emergency room)  Inflammation or drainage at the infection site  Severe back/neck pain  Any new symptoms which are concerning to you  Please note:  Although the local anesthetic injected can often make your back or neck feel good for several hours after the injection, the pain will likely return.  It takes 3-7 days for steroids to work in the epidural space.  You may not notice any pain relief for at least that one week.  If effective, we will often do a series of three injections spaced 3-6 weeks apart to maximally decrease your pain.  After the initial series, we generally will wait several months before considering a repeat injection of the same type.  If you have any questions, please call [phone] [facility] 

## 2015-05-23 ENCOUNTER — Encounter: Payer: Self-pay | Admitting: Pain Medicine

## 2015-05-23 DIAGNOSIS — M47816 Spondylosis without myelopathy or radiculopathy, lumbar region: Secondary | ICD-10-CM

## 2015-05-23 DIAGNOSIS — D1779 Benign lipomatous neoplasm of other sites: Secondary | ICD-10-CM | POA: Insufficient documentation

## 2015-05-23 DIAGNOSIS — K589 Irritable bowel syndrome without diarrhea: Secondary | ICD-10-CM | POA: Insufficient documentation

## 2015-05-23 DIAGNOSIS — I10 Essential (primary) hypertension: Secondary | ICD-10-CM

## 2015-05-23 DIAGNOSIS — T402X5A Adverse effect of other opioids, initial encounter: Secondary | ICD-10-CM | POA: Insufficient documentation

## 2015-05-23 DIAGNOSIS — F329 Major depressive disorder, single episode, unspecified: Secondary | ICD-10-CM | POA: Insufficient documentation

## 2015-05-23 DIAGNOSIS — Z87891 Personal history of nicotine dependence: Secondary | ICD-10-CM

## 2015-05-23 DIAGNOSIS — E538 Deficiency of other specified B group vitamins: Secondary | ICD-10-CM

## 2015-05-23 DIAGNOSIS — M47812 Spondylosis without myelopathy or radiculopathy, cervical region: Secondary | ICD-10-CM | POA: Insufficient documentation

## 2015-05-23 DIAGNOSIS — M533 Sacrococcygeal disorders, not elsewhere classified: Secondary | ICD-10-CM

## 2015-05-23 DIAGNOSIS — K5909 Other constipation: Secondary | ICD-10-CM | POA: Insufficient documentation

## 2015-05-23 DIAGNOSIS — M792 Neuralgia and neuritis, unspecified: Secondary | ICD-10-CM

## 2015-05-23 DIAGNOSIS — M4802 Spinal stenosis, cervical region: Secondary | ICD-10-CM

## 2015-05-23 DIAGNOSIS — E559 Vitamin D deficiency, unspecified: Secondary | ICD-10-CM | POA: Insufficient documentation

## 2015-05-23 DIAGNOSIS — M797 Fibromyalgia: Secondary | ICD-10-CM | POA: Insufficient documentation

## 2015-05-23 DIAGNOSIS — M542 Cervicalgia: Secondary | ICD-10-CM

## 2015-05-23 DIAGNOSIS — E039 Hypothyroidism, unspecified: Secondary | ICD-10-CM | POA: Insufficient documentation

## 2015-05-23 DIAGNOSIS — M79605 Pain in left leg: Secondary | ICD-10-CM

## 2015-05-23 DIAGNOSIS — G96198 Other disorders of meninges, not elsewhere classified: Secondary | ICD-10-CM

## 2015-05-23 DIAGNOSIS — M961 Postlaminectomy syndrome, not elsewhere classified: Secondary | ICD-10-CM

## 2015-05-23 DIAGNOSIS — K5903 Drug induced constipation: Secondary | ICD-10-CM | POA: Insufficient documentation

## 2015-05-23 DIAGNOSIS — M7918 Myalgia, other site: Secondary | ICD-10-CM

## 2015-05-23 DIAGNOSIS — K219 Gastro-esophageal reflux disease without esophagitis: Secondary | ICD-10-CM | POA: Insufficient documentation

## 2015-05-23 DIAGNOSIS — G8929 Other chronic pain: Secondary | ICD-10-CM | POA: Insufficient documentation

## 2015-05-23 DIAGNOSIS — M810 Age-related osteoporosis without current pathological fracture: Secondary | ICD-10-CM

## 2015-05-23 DIAGNOSIS — M79604 Pain in right leg: Secondary | ICD-10-CM

## 2015-05-23 DIAGNOSIS — F411 Generalized anxiety disorder: Secondary | ICD-10-CM

## 2015-05-23 DIAGNOSIS — G9619 Other disorders of meninges, not elsewhere classified: Secondary | ICD-10-CM

## 2015-05-23 DIAGNOSIS — F32A Depression, unspecified: Secondary | ICD-10-CM | POA: Insufficient documentation

## 2015-05-23 DIAGNOSIS — K449 Diaphragmatic hernia without obstruction or gangrene: Secondary | ICD-10-CM | POA: Insufficient documentation

## 2015-05-23 HISTORY — DX: Myalgia, other site: M79.18

## 2015-05-23 HISTORY — DX: Essential (primary) hypertension: I10

## 2015-05-23 HISTORY — DX: Drug induced constipation: K59.03

## 2015-05-23 HISTORY — DX: Other disorders of meninges, not elsewhere classified: G96.198

## 2015-05-23 HISTORY — DX: Neuralgia and neuritis, unspecified: M79.2

## 2015-05-23 HISTORY — DX: Postlaminectomy syndrome, not elsewhere classified: M96.1

## 2015-05-23 HISTORY — DX: Vitamin D deficiency, unspecified: E55.9

## 2015-05-23 HISTORY — DX: Age-related osteoporosis without current pathological fracture: M81.0

## 2015-05-23 HISTORY — DX: Other chronic pain: G89.29

## 2015-05-23 HISTORY — DX: Generalized anxiety disorder: F41.1

## 2015-05-23 HISTORY — DX: Pain in left leg: M79.605

## 2015-05-23 HISTORY — DX: Personal history of nicotine dependence: Z87.891

## 2015-05-23 HISTORY — DX: Benign lipomatous neoplasm of other sites: D17.79

## 2015-05-23 HISTORY — DX: Adverse effect of other opioids, initial encounter: T40.2X5A

## 2015-05-23 HISTORY — DX: Spondylosis without myelopathy or radiculopathy, cervical region: M47.812

## 2015-05-23 HISTORY — DX: Spondylosis without myelopathy or radiculopathy, lumbar region: M47.816

## 2015-05-23 HISTORY — DX: Other constipation: K59.09

## 2015-05-23 HISTORY — DX: Spinal stenosis, cervical region: M48.02

## 2015-05-23 HISTORY — DX: Cervicalgia: M54.2

## 2015-05-23 HISTORY — DX: Deficiency of other specified B group vitamins: E53.8

## 2015-05-23 HISTORY — DX: Irritable bowel syndrome, unspecified: K58.9

## 2015-05-23 NOTE — Progress Notes (Signed)
Patient's Name: Lydia Hernandez MRN: [id number] DOB: 1955-09-18 DOS: 05/22/2015  Primary Reason(s) for Visit: Encounter for Medication Management. CC: Back Pain and Leg Pain   HPI:   Lydia Hernandez is a 59 y.o. year old, female patient, who returns today as an established patient. She has Chronic pain; Chronic pain syndrome; Chronic low back pain; Failed back surgical syndrome; Postlaminectomy syndrome, lumbar region; Chronic radicular lumbar pain (left leg); Long term current use of opiate analgesic; Long term prescription opiate use; Opiate use; Opiate dependence ([place]); Encounter for therapeutic drug level monitoring; Lumbar spondylosis; Epidural fibrosis; Lumbar facet syndrome, bilateral; Chronic sacroiliac joint pain (Bilateral); Chronic lower extremity pain (Bilateral); Essential hypertension; History of tobacco abuse; Generalized anxiety disorder; Depression; Hiatal hernia; GERD (gastroesophageal reflux disease); Irritable bowel syndrome; Chronic constipation; Therapeutic opioid-induced constipation (OIC); Hypothyroidism; Fibromyalgia; Musculoskeletal pain; Neurogenic pain; Neuropathic pain; Vitamin D insufficiency; Osteoporosis; Vitamin B12 deficiency; Chronic neck pain; Cervical spondylosis; Lumbar facet hypertrophy; 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 Her primarily concern today is the Back Pain and Leg Pain    The patient returns to the clinic today for pharmacological management and postprocedure evaluation. She was last seen on 02/05/2015 at which time we provided her with a left-sided L23 lumbar epidural steroid injection under fluoroscopic guidance and IV sedation. The patient indicates having attained 100% relief of the pain does duration of the local anesthetic, followed by 65% relief of her symptoms for a couple months. She comes back today indicating that she would like to  have that repeated. She denies any side effects or problems with her medication regimen. We'll go ahead and schedule her palliative procedure as soon as possible. Today's Pain Score: 5  Pain Type: Chronic pain Pain Location: Back Pain Orientation: Left Pain Descriptors / Indicators: Aching, Constant, Sharp, Dull Pain Frequency: Constant  Date of Last Visit: Date of Last Visit: 02/05/15 Service Provided on Last Visit: Service Provided on Last Visit: Procedure (LESI)  Pharmacotherapy Review:   Side-effects or Adverse reactions: None reported. Effectiveness: Described as relatively effective, allowing for increase in activities of daily living (ADL). Onset of action: Within expected pharmacological parameters. Duration of action: Within normal limits for medication. Peak effect: Timing and results are as within normal expected parameters. [place] PMP: Compliant with practice rules and regulations. DST: Compliant with practice rules and regulations. Lab work: No new labs ordered by our practice. Treatment compliance: Compliant. Substance Use Disorder (SUD) Risk Level: Low Planned course of action: Continue therapy as is.  Post-Procedure Assessment:  Procedure done on last visit: Left L2-3 lumbar epidural steroid injection under fluoroscopic guidance and IV sedation. Side-effects or Adverse reactions: No significant issues reported. Sedation: Procedure was performed with sedation.  Results: Ultra-Short Term Relief (First 1 hour after procedure): 100 % Short Term Relief (Initial 4-6 hrs after procedure): 100 % Long Term Relief : 65 % (for couple of months)  Interpretation of Results: Short-term relief confirms injected site as etiology of pain. Long term relief is possibly due to sympathetic blockade, or the effects of steroids, if administered during procedure. Persistent relief would suggest effective anti-inflammatory effects from steroids.  Allergies: Ms. Sukup has No Known  Allergies.  Meds: The patient has a current medication list which includes the following prescription(s): cetirizine, fentanyl, gabapentin, hydrochlorothiazide, hydrocodone-acetaminophen, meloxicam, pantoprazole, potassium chloride, fentanyl, fentanyl, hydrocodone-acetaminophen, and hydrocodone-acetaminophen. Requested Prescriptions   Signed Prescriptions Disp Refills  . fentaNYL (DURAGESIC -  DOSED MCG/HR) 75 MCG/HR 10 patch 0    Sig: Place 1 patch (75 mcg total) onto the skin every 3 (three) days.  Marland Kitchen gabapentin (NEURONTIN) 300 MG capsule 120 capsule 2    Sig: Take 1 capsule (300 mg total) by mouth 4 (four) times daily. For nerve pain.  . meloxicam (MOBIC) 15 MG tablet 30 tablet 2    Sig: Take 1 tablet (15 mg total) by mouth daily.  Marland Kitchen HYDROcodone-acetaminophen (NORCO) 7.5-325 MG tablet 90 tablet 0    Sig: Take 1 tablet by mouth every 8 (eight) hours as needed for moderate pain.  Marland Kitchen HYDROcodone-acetaminophen (NORCO) 7.5-325 MG tablet 90 tablet 0    Sig: Take 1 tablet by mouth every 8 (eight) hours as needed for moderate pain.  Marland Kitchen HYDROcodone-acetaminophen (NORCO) 7.5-325 MG tablet 90 tablet 0    Sig: Take 1 tablet by mouth every 8 (eight) hours as needed for moderate pain.  . fentaNYL (DURAGESIC) 75 MCG/HR 10 patch 0    Sig: Place 1 patch (75 mcg total) onto the skin every 3 (three) days.  . fentaNYL (DURAGESIC) 75 MCG/HR 5 patch 0    Sig: Place 1 patch (75 mcg total) onto the skin every 3 (three) days.    ROS: Constitutional: Afebrile, no chills, well hydrated and well nourished Gastrointestinal: negative Musculoskeletal:negative Neurological: negative Behavioral/Psych: negative  PFSH: Medical:  Lydia Hernandez  has a past medical history of Hypertension; Chronic back pain; Anxiety; Depression; Hiatal hernia; Hypothyroidism; Fibromyalgia; GERD (gastroesophageal reflux disease); IBS (irritable bowel syndrome); Osteoarthritis; and History of tobacco abuse (05/23/2015). Family: family history  includes Cancer in her father and mother. Surgical:  has past surgical history that includes Back surgery; Abdominal hysterectomy; Neck surgery; Ankle surgery (Right); Hernia repair; and Cholecystectomy. Tobacco:  reports that she has been smoking.  She does not have any smokeless tobacco history on file. Alcohol:  reports that she does not drink alcohol. Drug:  reports that she does not use illicit drugs.  Physical Exam: Vitals:  Today's Vitals   05/22/15 1049 05/22/15 1050  BP: 134/102   Pulse: 79   Temp: 98.2 F (36.8 C)   Resp: 18   Height: 5\' 5"  (1.651 m)   Weight: 202 lb (91.627 kg)   SpO2: 98%   PainSc: 5  5   PainLoc: Back   Calculated BMI: Body mass index is 33.61 kg/(m^2). General appearance: alert, cooperative, appears stated age, moderate distress and morbidly obese Eyes: conjunctivae/corneas clear. PERRL, EOM's intact. Fundi benign. Lungs: No evidence respiratory distress, no audible rales or ronchi and no use of accessory muscles of respiration Neck: no adenopathy, no carotid bruit, no JVD, supple, symmetrical, trachea midline and thyroid not enlarged, symmetric, no tenderness/mass/nodules Back: symmetric, no curvature. ROM normal. No CVA tenderness. Extremities: extremities normal, atraumatic, no cyanosis or edema Pulses: 2+ and symmetric Skin: Skin color, texture, turgor normal. No rashes or lesions Neurologic: Gait: Antalgic    Assessment: Encounter Diagnosis:  Primary Diagnosis: Chronic pain [G89.29]  Plan: Lydia Hernandez was seen today for back pain and leg pain.  Diagnoses and all orders for this visit:  Chronic pain -     fentaNYL (DURAGESIC - DOSED MCG/HR) 75 MCG/HR; Place 1 patch (75 mcg total) onto the skin every 3 (three) days. -     gabapentin (NEURONTIN) 300 MG capsule; Take 1 capsule (300 mg total) by mouth 4 (four) times daily. For nerve pain. -     meloxicam (MOBIC) 15 MG tablet; Take 1 tablet (15 mg  total) by mouth daily. -      HYDROcodone-acetaminophen (NORCO) 7.5-325 MG tablet; Take 1 tablet by mouth every 8 (eight) hours as needed for moderate pain. -     HYDROcodone-acetaminophen (NORCO) 7.5-325 MG tablet; Take 1 tablet by mouth every 8 (eight) hours as needed for moderate pain. -     HYDROcodone-acetaminophen (NORCO) 7.5-325 MG tablet; Take 1 tablet by mouth every 8 (eight) hours as needed for moderate pain. -     fentaNYL (DURAGESIC) 75 MCG/HR; Place 1 patch (75 mcg total) onto the skin every 3 (three) days. -     fentaNYL (DURAGESIC) 75 MCG/HR; Place 1 patch (75 mcg total) onto the skin every 3 (three) days.  Chronic pain syndrome  Chronic low back pain  Failed back surgical syndrome -     LUMBAR EPIDURAL STEROID INJECTION; Future  Postlaminectomy syndrome, lumbar region  Chronic radicular lumbar pain (left leg)  Long term current use of opiate analgesic -     Drugs of abuse screen w/o alc, rtn urine-sln; Future  Long term prescription opiate use  Opiate use  Uncomplicated opioid dependence ([place])  Encounter for therapeutic drug level monitoring  Osteoarthritis of spine with radiculopathy, lumbar region  Epidural fibrosis  Lumbar facet syndrome, bilateral  Chronic sacroiliac joint pain (Bilateral)  Chronic pain of lower extremity, unspecified laterality  Essential hypertension  History of tobacco abuse  Generalized anxiety disorder  Depression  Hiatal hernia  Gastroesophageal reflux disease without esophagitis  Irritable bowel syndrome, unspecified type  Chronic constipation  Therapeutic opioid-induced constipation (OIC)  Fibromyalgia  Musculoskeletal pain  Neurogenic pain  Neuropathic pain  Vitamin D insufficiency  Osteoporosis  Vitamin B12 deficiency  Chronic neck pain  Cervical spondylosis  Lumbar facet hypertrophy  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 You have chosen to help yourself be healthier. Cigarettes bring nicotine, tar, carbon monoxide, and other irritants into your body. Your lungs, heart, and blood vessels will be able to work better without these poisons. There are many different ways to quit smoking. Nicotine gum, nicotine patches, a nicotine inhaler, or nicotine nasal spray can help with physical craving. Hypnosis, support groups, and medicines help break the habit of smoking. WHAT THINGS CAN I DO TO MAKE QUITTING EASIER?  Here are some tips to help you quit for good: 1. Pick a date when you will quit smoking completely. Tell all of your friends and family about your plan to quit on that date. 2. Do not try to slowly cut down on the number of cigarettes you are smoking. Pick a quit date and quit smoking completely starting on that day. 3. Throw away all cigarettes.  4. Clean and remove all ashtrays from your home, work, and car. 5. On a card, write down your reasons for quitting. Carry the card with you and read it when you get the urge to smoke. 6. Cleanse your body of nicotine. Drink enough water and fluids to keep your urine clear or pale yellow. Do this after quitting to flush the nicotine from your body. 7. Learn to predict your moods. Do not let a bad situation be your excuse to have a cigarette. Some situations in your life might tempt you into wanting a cigarette. 8. Never have "just one" cigarette. It leads to  wanting another and another. Remind yourself of your decision to quit. 9. Change habits associated with smoking. If you smoked while driving or when feeling stressed, try other activities to replace smoking. Stand up when drinking your coffee. Brush your teeth after eating. Sit in a different chair when you read the paper. Avoid alcohol while trying to quit, and try to drink fewer caffeinated  beverages. Alcohol and caffeine may urge you to smoke. 10. Avoid foods and drinks that can trigger a desire to smoke, such as sugary or spicy foods and alcohol. 11. Ask people who smoke not to smoke around you. 12. Have something planned to do right after eating or having a cup of coffee. For example, plan to take a walk or exercise. 13. Try a relaxation exercise to calm you down and decrease your stress. Remember, you may be tense and nervous for the first 2 weeks after you quit, but this will pass. 54. Find new activities to keep your hands busy. Play with a pen, coin, or rubber band. Doodle or draw things on paper. 15. Brush your teeth right after eating. This will help cut down on the craving for the taste of tobacco after meals. You can also try mouthwash.  16. Use oral substitutes in place of cigarettes. Try using lemon drops, carrots, cinnamon sticks, or chewing gum. Keep them handy so they are available when you have the urge to smoke. 17. When you have the urge to smoke, try deep breathing. 71. Designate your home as a nonsmoking area. 19. If you are a heavy smoker, ask your health care provider about a prescription for nicotine chewing gum. It can ease your withdrawal from nicotine. 20. Reward yourself. Set aside the cigarette money you save and buy yourself something nice. 21. Look for support from others. Join a support group or smoking cessation program. Ask someone at home or at work to help you with your plan to quit smoking. 22. Always ask yourself, "Do I need this cigarette or is this just a reflex?" Tell yourself, "Today, I choose not to smoke," or "I do not want to smoke." You are reminding yourself of your decision to quit. 23. Do not replace cigarette smoking with electronic cigarettes (commonly called e-cigarettes). The safety of e-cigarettes is unknown, and some may contain harmful chemicals. 24. If you relapse, do not give up! Plan ahead and think about what you will do the  next time you get the urge to smoke. HOW WILL I FEEL WHEN I QUIT SMOKING? You may have symptoms of withdrawal because your body is used to nicotine (the addictive substance in cigarettes). You may crave cigarettes, be irritable, feel very hungry, cough often, get headaches, or have difficulty concentrating. The withdrawal symptoms are only temporary. They are strongest when you first quit but will go away within 10-14 days. When withdrawal symptoms occur, stay in control. Think about your reasons for quitting. Remind yourself that these are signs that your body is healing and getting used to being without cigarettes. Remember that withdrawal symptoms are easier to treat than the major diseases that smoking can cause.  Even after the withdrawal is over, expect periodic urges to smoke. However, these cravings are generally short lived and will go away whether you smoke or not. Do not smoke! WHAT RESOURCES ARE AVAILABLE TO HELP ME QUIT SMOKING? Your health care provider can direct you to community resources or hospitals for support, which may include: 1. Group support. 2. Education. 3. Hypnosis. 4. Therapy.  This information is not intended to replace advice given to you by your health care provider. Make sure you discuss any questions you have with your health care provider.   Document Released: 04/04/2004 Document Revised: 07/28/2014 Document Reviewed: 12/23/2012 Elsevier Interactive Patient Education 2016 [place]  What are the risk, side effects and possible complications? Generally speaking, most procedures are safe.  However, with any procedure there are risks, side effects, and the possibility of complications.  The risks and complications are dependent upon the sites that are lesioned, or the type of nerve block to be performed.  The closer the procedure is to the spine, the more serious the risks are.  Great care is taken when placing the radio frequency  needles, block needles or lesioning probes, but sometimes complications can occur. 1. Infection: Any time there is an injection through the skin, there is a risk of infection.  This is why sterile conditions are used for these blocks.  There are four possible types of infection. 1. Localized skin infection. 2. Central Nervous System Infection-This can be in the form of Meningitis, which can be deadly. 3. Epidural Infections-This can be in the form of an epidural abscess, which can cause pressure inside of the spine, causing compression of the spinal cord with subsequent paralysis. This would require an emergency surgery to decompress, and there are no guarantees that the patient would recover from the paralysis. 4. Discitis-This is an infection of the intervertebral discs.  It occurs in about 1% of discography procedures.  It is difficult to treat and it may lead to surgery.        2. Pain: the needles have to go through skin and soft tissues, will cause soreness.       3. Damage to internal structures:  The nerves to be lesioned may be near blood vessels or    other nerves which can be potentially damaged.       4. Bleeding: Bleeding is more common if the patient is taking blood thinners such as  aspirin, Coumadin, Ticiid, Plavix, etc., or if he/she have some genetic predisposition  such as hemophilia. Bleeding into the spinal canal can cause compression of the spinal  cord with subsequent paralysis.  This would require an emergency surgery to  decompress and there are no guarantees that the patient would recover from the  paralysis.       5. Pneumothorax:  Puncturing of a lung is a possibility, every time a needle is introduced in  the area of the chest or upper back.  Pneumothorax refers to free air around the  collapsed lung(s), inside of the thoracic cavity (chest cavity).  Another two possible  complications related to a similar event would include: Hemothorax and Chylothorax.   These are variations  of the Pneumothorax, where instead of air around the collapsed  lung(s), you may have blood or chyle, respectively.       6. Spinal headaches: They may occur with any procedures in the area of the spine.       7. Persistent CSF (Cerebro-Spinal Fluid) leakage: This is a rare problem, but may occur  with prolonged intrathecal or epidural catheters either due to the formation of a fistulous  track or a dural tear.       8. Nerve damage: By working so close to the spinal cord, there is always a possibility of  nerve damage, which could be as serious as a permanent spinal cord injury  with  paralysis.       9. Death:  Although rare, severe deadly allergic reactions known as "Anaphylactic  reaction" can occur to any of the medications used.      10. Worsening of the symptoms:  We can always make thing worse.  What are the chances of something like this happening? Chances of any of this occuring are extremely low.  By statistics, you have more of a chance of getting killed in a motor vehicle accident: while driving to the hospital than any of the above occurring .  Nevertheless, you should be aware that they are possibilities.  In general, it is similar to taking a shower.  Everybody knows that you can slip, hit your head and get killed.  Does that mean that you should not shower again?  Nevertheless always keep in mind that statistics do not mean anything if you happen to be on the wrong side of them.  Even if a procedure has a 1 (one) in a 1,000,000 (million) chance of going wrong, it you happen to be that one..Also, keep in mind that by statistics, you have more of a chance of having something go wrong when taking medications.  Who should not have this procedure? If you are on a blood thinning medication (e.g. Coumadin, Plavix, see list of "Blood Thinners"), or if you have an active infection going on, you should not have the procedure.  If you are taking any blood thinners, please inform your  physician.  How should I prepare for this procedure?  Do not eat or drink anything at least six hours prior to the procedure.  Bring a driver with you .  It cannot be a taxi.  Come accompanied by an adult that can drive you back, and that is strong enough to help you if your legs get weak or numb from the local anesthetic.  Take all of your medicines the morning of the procedure with just enough water to swallow them.  If you have diabetes, make sure that you are scheduled to have your procedure done first thing in the morning, whenever possible.  If you have diabetes, take only half of your insulin dose and notify our nurse that you have done so as soon as you arrive at the clinic.  If you are diabetic, but only take blood sugar pills (oral hypoglycemic), then do not take them on the morning of your procedure.  You may take them after you have had the procedure.  Do not take aspirin or any aspirin-containing medications, at least eleven (11) days prior to the procedure.  They may prolong bleeding.  Wear loose fitting clothing that may be easy to take off and that you would not mind if it got stained with Betadine or blood.  Do not wear any jewelry or perfume  Remove any nail coloring.  It will interfere with some of our monitoring equipment.  NOTE: Remember that this is not meant to be interpreted as a complete list of all possible complications.  Unforeseen problems may occur.  BLOOD THINNERS The following drugs contain aspirin or other products, which can cause increased bleeding during surgery and should not be taken for 2 weeks prior to and 1 week after surgery.  If you should need take something for relief of minor pain, you may take acetaminophen which is found in Tylenol,m Datril, Anacin-3 and Panadol. It is not blood thinner. The products listed below are.  Do not take any of the products  listed below in addition to any listed on your instruction sheet.  A.P.C or A.P.C with  Codeine Codeine Phosphate Capsules #3 Ibuprofen Ridaura  ABC compound Congesprin Imuran rimadil  Advil Cope Indocin Robaxisal  Alka-Seltzer Effervescent Pain Reliever and Antacid Coricidin or Coricidin-D  Indomethacin Rufen  Alka-Seltzer plus Cold Medicine Cosprin Ketoprofen S-A-C Tablets  Anacin Analgesic Tablets or Capsules Coumadin Korlgesic Salflex  Anacin Extra Strength Analgesic tablets or capsules CP-2 Tablets Lanoril Salicylate  Anaprox Cuprimine Capsules Levenox Salocol  Anexsia-D Dalteparin Magan Salsalate  Anodynos Darvon compound Magnesium Salicylate Sine-off  Ansaid Dasin Capsules Magsal Sodium Salicylate  Anturane Depen Capsules Marnal Soma  APF Arthritis pain formula Dewitt's Pills Measurin Stanback  Argesic Dia-Gesic Meclofenamic Sulfinpyrazone  Arthritis Bayer Timed Release Aspirin Diclofenac Meclomen Sulindac  Arthritis pain formula Anacin Dicumarol Medipren Supac  Analgesic (Safety coated) Arthralgen Diffunasal Mefanamic Suprofen  Arthritis Strength Bufferin Dihydrocodeine Mepro Compound Suprol  Arthropan liquid Dopirydamole Methcarbomol with Aspirin Synalgos  ASA tablets/Enseals Disalcid Micrainin Tagament  Ascriptin Doan's Midol Talwin  Ascriptin A/D Dolene Mobidin Tanderil  Ascriptin Extra Strength Dolobid Moblgesic Ticlid  Ascriptin with Codeine Doloprin or Doloprin with Codeine Momentum Tolectin  Asperbuf Duoprin Mono-gesic Trendar  Aspergum Duradyne Motrin or Motrin IB Triminicin  Aspirin plain, buffered or enteric coated Durasal Myochrisine Trigesic  Aspirin Suppositories Easprin Nalfon Trillsate  Aspirin with Codeine Ecotrin Regular or Extra Strength Naprosyn Uracel  Atromid-S Efficin Naproxen Ursinus  Auranofin Capsules Elmiron Neocylate Vanquish  Axotal Emagrin Norgesic Verin  Azathioprine Empirin or Empirin with Codeine Normiflo Vitamin E  Azolid Emprazil Nuprin Voltaren  Bayer Aspirin plain, buffered or children's or timed BC Tablets or powders  Encaprin Orgaran Warfarin Sodium  Buff-a-Comp Enoxaparin Orudis Zorpin  Buff-a-Comp with Codeine Equegesic Os-Cal-Gesic   Buffaprin Excedrin plain, buffered or Extra Strength Oxalid   Bufferin Arthritis Strength Feldene Oxphenbutazone   Bufferin plain or Extra Strength Feldene Capsules Oxycodone with Aspirin   Bufferin with Codeine Fenoprofen Fenoprofen Pabalate or Pabalate-SF   Buffets II Flogesic Panagesic   Buffinol plain or Extra Strength Florinal or Florinal with Codeine Panwarfarin   Buf-Tabs Flurbiprofen Penicillamine   Butalbital Compound Four-way cold tablets Penicillin   Butazolidin Fragmin Pepto-Bismol   Carbenicillin Geminisyn Percodan   Carna Arthritis Reliever Geopen Persantine   Carprofen Gold's salt Persistin   Chloramphenicol Goody's Phenylbutazone   Chloromycetin Haltrain Piroxlcam   Clmetidine heparin Plaquenil   Cllnoril Hyco-pap Ponstel   Clofibrate Hydroxy chloroquine Propoxyphen         Before stopping any of these medications, be sure to consult the physician who ordered them.  Some, such as Coumadin (Warfarin) are ordered to prevent or treat serious conditions such as "deep thrombosis", "pumonary embolisms", and other heart problems.  The amount of time that you may need off of the medication may also vary with the medication and the reason for which you were taking it.  If you are taking any of these medications, please make sure you notify your pain physician before you undergo any procedures.         Epidural Steroid Injection Patient Information  Description: The epidural space surrounds the nerves as they exit the spinal cord.  In some patients, the nerves can be compressed and inflamed by a bulging disc or a tight spinal canal (spinal stenosis).  By injecting steroids into the epidural space, we can bring irritated nerves into direct contact with a potentially helpful medication.  These steroids act directly on the irritated nerves and can reduce  swelling  and inflammation which often leads to decreased pain.  Epidural steroids may be injected anywhere along the spine and from the neck to the low back depending upon the location of your pain.   After numbing the skin with local anesthetic (like Novocaine), a small needle is passed into the epidural space slowly.  You may experience a sensation of pressure while this is being done.  The entire block usually last less than 10 minutes.  Conditions which may be treated by epidural steroids:   Low back and leg pain  Neck and arm pain  Spinal stenosis  Post-laminectomy syndrome  Herpes zoster (shingles) pain  Pain from compression fractures  Preparation for the injection:  5. Do not eat any solid food or dairy products within 6 hours of your appointment.  6. You may drink clear liquids up to 2 hours before appointment.  Clear liquids include water, black coffee, juice or soda.  No milk or cream please. 7. You may take your regular medication, including pain medications, with a sip of water before your appointment  Diabetics should hold regular insulin (if taken separately) and take 1/2 normal NPH dos the morning of the procedure.  Carry some sugar containing items with you to your appointment. 8. A driver must accompany you and be prepared to drive you home after your procedure.  9. Bring all your current medications with your. 10. An IV may be inserted and sedation may be given at the discretion of the physician.   11. A blood pressure cuff, EKG and other monitors will often be applied during the procedure.  Some patients may need to have extra oxygen administered for a short period. 12. You will be asked to provide medical information, including your allergies, prior to the procedure.  We must know immediately if you are taking blood thinners (like Coumadin/Warfarin)  Or if you are allergic to IV iodine contrast (dye). We must know if you could possible be pregnant.  Possible  side-effects:  Bleeding from needle site  Infection (rare, may require surgery)  Nerve injury (rare)  Numbness & tingling (temporary)  Difficulty urinating (rare, temporary)  Spinal headache ( a headache worse with upright posture)  Light -headedness (temporary)  Pain at injection site (several days)  Decreased blood pressure (temporary)  Weakness in arm/leg (temporary)  Pressure sensation in back/neck (temporary)  Call if you experience:  Fever/chills associated with headache or increased back/neck pain.  Headache worsened by an upright position.  New onset weakness or numbness of an extremity below the injection site  Hives or difficulty breathing (go to the emergency room)  Inflammation or drainage at the infection site  Severe back/neck pain  Any new symptoms which are concerning to you  Please note:  Although the local anesthetic injected can often make your back or neck feel good for several hours after the injection, the pain will likely return.  It takes 3-7 days for steroids to work in the epidural space.  You may not notice any pain relief for at least that one week.  If effective, we will often do a series of three injections spaced 3-6 weeks apart to maximally decrease your pain.  After the initial series, we generally will wait several months before considering a repeat injection of the same type.  If you have any questions, please call [phone] [facility]  Medications discontinued today:  Medications Discontinued During This Encounter  Medication Reason  . clonazePAM (KLONOPIN) 0.5 MG  tablet Error  . estrogens, conjugated, (PREMARIN) 0.3 MG tablet Error  . HYDROcodone-acetaminophen (LORTAB) 7.5-500 MG per tablet Error  . methocarbamol (ROBAXIN) 500 MG tablet Error  . metoprolol succinate (TOPROL-XL) 25 MG 24 hr tablet Error  . naproxen (NAPROSYN) 500 MG tablet Error  . oxyCODONE-acetaminophen (PERCOCET)  5-325 MG per tablet Error  . fentaNYL (DURAGESIC - DOSED MCG/HR) 75 MCG/HR Reorder  . gabapentin (NEURONTIN) 300 MG capsule Reorder  . meloxicam (MOBIC) 15 MG tablet Reorder  . HYDROcodone-acetaminophen (NORCO) 7.5-325 MG tablet Reorder   Medications administered today:  Ms. Keagy had no medications administered during this visit.  Primary Care Physician: Gilford Rile, MD Location: [facility] Note by: Kathlen Brunswick. Dossie Arbour, M.D, DABA, DABAPM, DABPM, DABIPP, FIPP

## 2015-05-26 NOTE — Telephone Encounter (Signed)
Please remind the patient and the pharmacy that my policy is not to fax or call any prescriptions. Patients must attend their appointments at which time, after a proper assessment, the prescriptions may be refilled, or the medication/dose changed, if necessary. If the patient feels that this needs to be addressed as soon as possible, please work the patient into an earlier appointment or a cancellation.

## 2015-05-30 LAB — TOXASSURE SELECT 13 (MW), URINE: PDF: 0

## 2015-06-04 ENCOUNTER — Ambulatory Visit: Payer: Medicare Other | Admitting: Pain Medicine

## 2015-06-05 ENCOUNTER — Ambulatory Visit: Payer: Medicare Other | Attending: Pain Medicine | Admitting: Pain Medicine

## 2015-06-05 ENCOUNTER — Encounter: Payer: Self-pay | Admitting: Pain Medicine

## 2015-06-05 VITALS — BP 112/68 | HR 80 | Temp 98.5°F | Resp 16 | Ht 65.5 in | Wt 203.0 lb

## 2015-06-05 DIAGNOSIS — Z981 Arthrodesis status: Secondary | ICD-10-CM | POA: Diagnosis not present

## 2015-06-05 DIAGNOSIS — M47817 Spondylosis without myelopathy or radiculopathy, lumbosacral region: Secondary | ICD-10-CM | POA: Diagnosis not present

## 2015-06-05 DIAGNOSIS — F1190 Opioid use, unspecified, uncomplicated: Secondary | ICD-10-CM | POA: Diagnosis not present

## 2015-06-05 DIAGNOSIS — G8929 Other chronic pain: Secondary | ICD-10-CM | POA: Diagnosis not present

## 2015-06-05 DIAGNOSIS — M5416 Radiculopathy, lumbar region: Secondary | ICD-10-CM

## 2015-06-05 DIAGNOSIS — M4802 Spinal stenosis, cervical region: Secondary | ICD-10-CM | POA: Diagnosis not present

## 2015-06-05 DIAGNOSIS — K219 Gastro-esophageal reflux disease without esophagitis: Secondary | ICD-10-CM | POA: Insufficient documentation

## 2015-06-05 DIAGNOSIS — M961 Postlaminectomy syndrome, not elsewhere classified: Secondary | ICD-10-CM

## 2015-06-05 DIAGNOSIS — M545 Low back pain: Secondary | ICD-10-CM | POA: Insufficient documentation

## 2015-06-05 DIAGNOSIS — I10 Essential (primary) hypertension: Secondary | ICD-10-CM | POA: Insufficient documentation

## 2015-06-05 DIAGNOSIS — M797 Fibromyalgia: Secondary | ICD-10-CM | POA: Diagnosis not present

## 2015-06-05 DIAGNOSIS — F329 Major depressive disorder, single episode, unspecified: Secondary | ICD-10-CM | POA: Insufficient documentation

## 2015-06-05 DIAGNOSIS — K589 Irritable bowel syndrome without diarrhea: Secondary | ICD-10-CM | POA: Diagnosis not present

## 2015-06-05 DIAGNOSIS — M4726 Other spondylosis with radiculopathy, lumbar region: Secondary | ICD-10-CM

## 2015-06-05 DIAGNOSIS — E538 Deficiency of other specified B group vitamins: Secondary | ICD-10-CM | POA: Diagnosis not present

## 2015-06-05 DIAGNOSIS — E039 Hypothyroidism, unspecified: Secondary | ICD-10-CM | POA: Insufficient documentation

## 2015-06-05 DIAGNOSIS — E559 Vitamin D deficiency, unspecified: Secondary | ICD-10-CM | POA: Insufficient documentation

## 2015-06-05 DIAGNOSIS — F411 Generalized anxiety disorder: Secondary | ICD-10-CM | POA: Insufficient documentation

## 2015-06-05 DIAGNOSIS — R52 Pain, unspecified: Secondary | ICD-10-CM | POA: Diagnosis present

## 2015-06-05 MED ORDER — ROPIVACAINE HCL 2 MG/ML IJ SOLN: 2.0000 mL | Freq: Once | INTRAMUSCULAR | Status: DC

## 2015-06-05 MED ORDER — IOHEXOL 180 MG/ML  SOLN: 10.0000 mL | Freq: Once | INTRAMUSCULAR | Status: DC | PRN

## 2015-06-05 MED ORDER — FENTANYL CITRATE (PF) 100 MCG/2ML IJ SOLN
INTRAMUSCULAR | Status: AC
  Administered 2015-06-05: 100 ug via INTRAVENOUS
  Filled 2015-06-05: qty 2

## 2015-06-05 MED ORDER — SODIUM CHLORIDE 0.9 % IJ SOLN
INTRAMUSCULAR | Status: AC
  Administered 2015-06-05: 10:00:00
  Filled 2015-06-05: qty 10

## 2015-06-05 MED ORDER — LACTATED RINGERS IV SOLN: 1000.0000 mL | INTRAVENOUS | Status: AC

## 2015-06-05 MED ORDER — TRIAMCINOLONE ACETONIDE 40 MG/ML IJ SUSP
INTRAMUSCULAR | Status: AC
  Administered 2015-06-05: 10:00:00
  Filled 2015-06-05: qty 1

## 2015-06-05 MED ORDER — FENTANYL CITRATE (PF) 100 MCG/2ML IJ SOLN: 100.0000 ug | Freq: Once | INTRAMUSCULAR | Status: DC

## 2015-06-05 MED ORDER — SODIUM CHLORIDE 0.9 % IJ SOLN: 2.0000 mL | Freq: Once | INTRAMUSCULAR | Status: DC

## 2015-06-05 MED ORDER — TRIAMCINOLONE ACETONIDE 40 MG/ML IJ SUSP: 40.0000 mg | Freq: Once | INTRAMUSCULAR | Status: DC

## 2015-06-05 MED ORDER — LIDOCAINE HCL (PF) 1 % IJ SOLN: 10.0000 mL | Freq: Once | INTRAMUSCULAR | Status: DC

## 2015-06-05 MED ORDER — LIDOCAINE HCL (PF) 1 % IJ SOLN
INTRAMUSCULAR | Status: AC
  Administered 2015-06-05: 10:00:00
  Filled 2015-06-05: qty 5

## 2015-06-05 MED ORDER — MIDAZOLAM HCL 5 MG/5ML IJ SOLN: 5.0000 mg | Freq: Once | INTRAMUSCULAR | Status: DC

## 2015-06-05 MED ORDER — ROPIVACAINE HCL 2 MG/ML IJ SOLN
INTRAMUSCULAR | Status: AC
  Administered 2015-06-05: 10:00:00
  Filled 2015-06-05: qty 10

## 2015-06-05 MED ORDER — MIDAZOLAM HCL 5 MG/5ML IJ SOLN
INTRAMUSCULAR | Status: AC
  Administered 2015-06-05: 4 mg via INTRAVENOUS
  Filled 2015-06-05: qty 5

## 2015-06-05 NOTE — Progress Notes (Signed)
Patient's Name: Lydia Hernandez MRN: [id number] DOB: Jan 08, 1956 DOS: 06/05/2015  Primary Reason(s) for Visit: Interventional Pain Management Treatment. CC: Pain   Pre-Procedure Assessment: Lydia Hernandez is a 59 y.o. year old, female patient, seen today for interventional treatment. She has Chronic pain; Chronic pain syndrome; Chronic low back pain; Failed back surgical syndrome; Postlaminectomy syndrome, lumbar region; Chronic radicular lumbar pain (left leg); Long term current use of opiate analgesic; Long term prescription opiate use; Opiate use; Opiate dependence ([place]); Encounter for therapeutic drug level monitoring; Lumbar spondylosis; Epidural fibrosis; Lumbar facet syndrome, bilateral; Chronic sacroiliac joint pain (Bilateral); Chronic lower extremity pain (Bilateral); Essential hypertension; History of tobacco abuse; Generalized anxiety disorder; Depression; Hiatal hernia; GERD (gastroesophageal reflux disease); Irritable bowel syndrome; Chronic constipation; Therapeutic opioid-induced constipation (OIC); Hypothyroidism; Fibromyalgia; Musculoskeletal pain; Neurogenic pain; Neuropathic pain; Vitamin D insufficiency; Osteoporosis; Vitamin B12 deficiency; Chronic neck pain; Cervical spondylosis; Lumbar facet hypertrophy; 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 Her primarily concern today is the Pain Verification of the correct person, correct site (including marking of site), and correct procedure were performed and confirmed by the patient. Today's Vitals   06/05/15 0945  BP: 140/84  Pulse: 73  Temp: 98.5 F (36.9 C)  TempSrc: Oral  Resp: 16  Height: 5' 5.5" (1.664 m)  Weight: 203 lb (92.08 kg)  SpO2: 98%  PainSc: 6   PainLoc: Back  Calculated BMI: Body mass index is 33.26 kg/(m^2). Allergies: She has No Known Allergies.. Primary Diagnosis: Chronic radicular lumbar pain  [M54.16, G89.29]  Procedure: Type: Palliative Inter-Laminar Epidural Steroid Injection Region: Lumbar Level: L2-3 Level. Laterality: Left-Sided Paramedial  Indications: Spondylosis, Lumbosacral Region  Consent: Secured. Under the influence of no sedatives a written informed consent was obtained, after having provided information on the risks and possible complications. To fulfill our ethical and legal obligations, as recommended by the American Medical Association's Code of Ethics, we have provided information to the patient about our clinical impression; the nature and purpose of the treatment or procedure; the risks, benefits, and possible complications of the intervention; alternatives; the risk(s) and benefit(s) of the alternative treatment(s) or procedure(s); and the risk(s) and benefit(s) of doing nothing. The patient was provided information about the risks and possible complications associated with the procedure. In the case of spinal procedures these may include, but are not limited to, failure to achieve desired goals, infection, bleeding, organ or nerve damage, allergic reactions, paralysis, and death. In addition, the patient was informed that Medicine is not an exact science; therefore, there is also the possibility of unforeseen risks and possible complications that may result in a catastrophic outcome. The patient indicated having understood very clearly. We have given the patient no guarantees and we have made no promises. Enough time was given to the patient to ask questions, all of which were answered to the patient's satisfaction.  Pre-Procedure Preparation: Safety Precautions: Allergies reviewed. Appropriate site, procedure, and patient were confirmed by following the Joint Commission's Universal Protocol (UP.01.01.01), in the form of a "Time Out". The patient was asked to confirm marked site and procedure, before commencing. The patient was asked about blood thinners, or active  infections, both of which were denied. Patient was assessed for positional comfort and all pressure points were checked before starting procedure. Monitoring:  As per clinic protocol. Infection Control Precautions: Sterile technique used. Standard Universal Precautions were taken as recommended by the Department of Human  [place] for Disease Control and Prevention (CDC). Standard pre-surgical skin prep was conducted. Respiratory hygiene and cough etiquette was practiced. Hand hygiene observed. Safe injection practices and needle disposal techniques followed. SDV (single dose vial) medications used. Medications properly checked for expiration dates and contaminants. Personal protective equipment (PPE) used: Surgical mask. Sterile double glove technique. Radiation resistant gloves. Sterile surgical gloves.  Anesthesia, Analgesia, Anxiolysis: Type: Moderate (Conscious) Sedation & Local Anesthesia. Meaningful verbal contact was maintained, with the patient at all times during the procedure. Local Anesthetic(s): Lidocaine 1% Route: Intravenous (IV) IV Access: Secured. Sedation: Meaningful verbal contact was maintained at all times during the procedure. Indication(s):Anxiety  Description of Procedure Process:  Time-out: "Time-out" completed before starting procedure, as per protocol. Position: Prone Target Area: For Epidural Steroid injections, the target area is the  interlaminar space, initially targeting the lower border of the superior vertebral body lamina. Approach: Posterior approach. Area Prepped: Entire Posterior Lumbosacral Region Prepping solution: ChloraPrep (2% chlorhexidine gluconate and 70% isopropyl alcohol) Safety Precautions: Aspiration looking for blood return was conducted prior to all injections. At no point did we inject any substances, as a needle was being advanced. No attempts were made at seeking any paresthesias. Safe injection practices and needle disposal techniques  used. Medications properly checked for expiration dates. SDV (single dose vial) medications used. Description of the Procedure: Protocol guidelines were followed. The patient was placed in position over the fluoroscopy table. The target area was identified and the area prepped in the usual manner. Skin desensitized using vapocoolant spray. Skin & deeper tissues infiltrated with local anesthetic. Appropriate amount of time allowed to pass for local anesthetics to take effect. The procedure needle was introduced through the skin, ipsilateral to the reported pain, and advanced to the target area. Bone was contacted and the needle walked caudad, until the lamina was cleared. The epidural space was identified using "loss-of-resistance technique" with 2-3 ml of PF-NaCl (0.9% NSS), in a 5cc LOR glass syringe. Proper needle placement secured. Negative aspiration confirmed. Solution injected in intermittent fashion, asking for systemic symptoms every 0.5cc of injectate. The needles were then removed and the area cleansed, making sure to leave some of the prepping solution back to take advantage of its long term bactericidal properties. EBL: None Materials & Medications Used:  Needle(s) Used: 20g - 10cm, Tuohy-style epidural needle Medications Administered today: Ms. Spinner had no medications administered during this visit.Please see chart orders for dosing details.  Imaging Guidance:  Type of Imaging Technique: Fluoroscopy Guidance (Spinal) Indication(s): Assistance in needle guidance and placement for procedures requiring needle placement in or near specific anatomical locations not easily accessible without such assistance. Exposure Time: Please see nurses notes. Contrast: Before injecting any contrast, we confirmed that the patient did not have an allergy to iodine, shellfish, or radiological contrast. Once satisfactory needle placement was completed at the desired level, radiological contrast was injected.  Injection was conducted under continuous fluoroscopic guidance. Injection of contrast accomplished without complications. See chart for type and volume of contrast used. Fluoroscopic Guidance: I was personally present in the fluoroscopy suite, where the patient was placed in position for the procedure, over the fluoroscopy-compatible table. Fluoroscopy was manipulated, using "Tunnel Vision Technique", to obtain the best possible view of the target area, on the affected side. Parallax error was corrected before commencing the procedure. A "direction-depth-direction" technique was used to introduce the needle under continuous pulsed fluoroscopic guidance. Once the target was reached, antero-posterior, oblique, and lateral fluoroscopic projection views were taken to  confirm needle placement in all planes. Permanently recorded images stored by scanning into EMR. Interpretation: Intraoperative imaging interpretation by performing Physician. Adequate needle placement confirmed. Adequate needle placement confirmed in AP, lateral, & Oblique Views. Appropriate spread of contrast to desired area. No evidence of afferent or efferent intravascular uptake. No intrathecal or subarachnoid spread observed. Permanent hardcopy images in multiple planes scanned into the patient's record.  Antibiotics:  Type:  Antibiotics Given (last 72 hours)    None      Indication(s): No indications identified.  Post-operative Assessment:  Complications: No immediate post-treatment complications were observed. Relevant Post-operative Information:  Disposition: Return to clinic for follow-up evaluation. The patient tolerated the entire procedure well. A repeat set of vitals were taken after the procedure and the patient was kept under observation following institutional policy, for this procedure. Post-procedural neurological assessment was performed, showing return to baseline, prior to discharge. The patient was discharged home, once  institutional criteria were met. The patient was provided with post-procedure discharge instructions, including a section on how to identify potential problems. Should any problems arise concerning this procedure, the patient was given instructions to immediately contact us, at any time, without hesitation. In any case, we plan to contact the patient by telephone for a follow-up status report regarding this interventional procedure. Comments:  No additional relevant information.  Primary Care Physician: Gilford Rile, MD Location: [facility] Facility Note by: Kathlen Brunswick. Dossie Arbour, M.D, DABA, DABAPM, DABPM, DABIPP, FIPP  Disclaimer:  Medicine is not an exact science. The only guarantee in medicine is that nothing is guaranteed. It is important to note that the decision to proceed with this intervention was based on the information collected from the patient. The Data and conclusions were drawn from the patient's questionnaire, the interview, and the physical examination. Because the information was provided in large part by the patient, it cannot be guaranteed that it has not been purposely or unconsciously manipulated. Every effort has been made to obtain as much relevant data as possible for this evaluation. It is important to note that the conclusions that lead to this procedure are derived in large part from the available data. Always take into account that the treatment will also be dependent on availability of resources and existing treatment guidelines, considered by other Pain Management Practitioners as being Hernandez knowledge and practice, at the time of the intervention. For Medico-Legal purposes, it is also important to point out that variation in procedural techniques and pharmacological choices are the acceptable norm. The indications, contraindications, technique, and results of the above procedure should only be interpreted and judged by a Board-Certified Interventional Pain  Specialist with extensive familiarity and expertise in the same exact procedure and technique. Attempts at providing opinions without similar or greater experience and expertise than that of the treating physician will be considered as inappropriate and unethical, and shall result in a formal complaint to the state medical board and applicable specialty societies.

## 2015-06-05 NOTE — Patient Instructions (Signed)
Pain Management Discharge Instructions  General Discharge Instructions :  If you need to reach your doctor call: Monday-Friday 8:00 am - 4:00 pm at [phone] or toll free [phone].  After clinic hours [phone] to have operator reach doctor.  Bring all of your medication bottles to all your appointments in the pain clinic.  To cancel or reschedule your appointment with Pain Management please remember to call 24 hours in advance to avoid a fee.  Refer to the educational materials which you have been given on: General Risks, I had my Procedure. Discharge Instructions, Post Sedation.  Post Procedure Instructions:  The drugs you were given will stay in your system until tomorrow, so for the next 24 hours you should not drive, make any legal decisions or drink any alcoholic beverages.  You may eat anything you prefer, but it is better to start with liquids then soups and crackers, and gradually work up to solid foods.  Please notify your doctor immediately if you have any unusual bleeding, trouble breathing or pain that is not related to your normal pain.  Depending on the type of procedure that was done, some parts of your body may feel week and/or numb.  This usually clears up by tonight or the next day.  Walk with the use of an assistive device or accompanied by an adult for the 24 hours.  You may use ice on the affected area for the first 24 hours.  Put ice in a Ziploc bag and cover with a towel and place against area 15 minutes on 15 minutes off.  You may switch to heat after 24 hours.Epidural Steroid Injection Patient Information  Description: The epidural space surrounds the nerves as they exit the spinal cord.  In some patients, the nerves can be compressed and inflamed by a bulging disc or a tight spinal canal (spinal stenosis).  By injecting steroids into the epidural space, we can bring irritated nerves into direct contact with a potentially helpful medication.  These  steroids act directly on the irritated nerves and can reduce swelling and inflammation which often leads to decreased pain.  Epidural steroids may be injected anywhere along the spine and from the neck to the low back depending upon the location of your pain.   After numbing the skin with local anesthetic (like Novocaine), a small needle is passed into the epidural space slowly.  You may experience a sensation of pressure while this is being done.  The entire block usually last less than 10 minutes.  Conditions which may be treated by epidural steroids:   Low back and leg pain  Neck and arm pain  Spinal stenosis  Post-laminectomy syndrome  Herpes zoster (shingles) pain  Pain from compression fractures  Preparation for the injection:  1. Do not eat any solid food or dairy products within 6 hours of your appointment.  2. You may drink clear liquids up to 2 hours before appointment.  Clear liquids include water, black coffee, juice or soda.  No milk or cream please. 3. You may take your regular medication, including pain medications, with a sip of water before your appointment  Diabetics should hold regular insulin (if taken separately) and take 1/2 normal NPH dos the morning of the procedure.  Carry some sugar containing items with you to your appointment. 4. A driver must accompany you and be prepared to drive you home after your procedure.  5. Bring all your current medications with your. 6. An IV may be inserted and   sedation may be given at the discretion of the physician.   7. A blood pressure cuff, EKG and other monitors will often be applied during the procedure.  Some patients may need to have extra oxygen administered for a short period. 8. You will be asked to provide medical information, including your allergies, prior to the procedure.  We must know immediately if you are taking blood thinners (like Coumadin/Warfarin)  Or if you are allergic to IV iodine contrast (dye). We must  know if you could possible be pregnant.  Possible side-effects:  Bleeding from needle site  Infection (rare, may require surgery)  Nerve injury (rare)  Numbness & tingling (temporary)  Difficulty urinating (rare, temporary)  Spinal headache ( a headache worse with upright posture)  Light -headedness (temporary)  Pain at injection site (several days)  Decreased blood pressure (temporary)  Weakness in arm/leg (temporary)  Pressure sensation in back/neck (temporary)  Call if you experience:  Fever/chills associated with headache or increased back/neck pain.  Headache worsened by an upright position.  New onset weakness or numbness of an extremity below the injection site  Hives or difficulty breathing (go to the emergency room)  Inflammation or drainage at the infection site  Severe back/neck pain  Any new symptoms which are concerning to you  Please note:  Although the local anesthetic injected can often make your back or neck feel good for several hours after the injection, the pain will likely return.  It takes 3-7 days for steroids to work in the epidural space.  You may not notice any pain relief for at least that one week.  If effective, we will often do a series of three injections spaced 3-6 weeks apart to maximally decrease your pain.  After the initial series, we generally will wait several months before considering a repeat injection of the same type.  If you have any questions, please call [phone] [facility] 

## 2015-06-06 ENCOUNTER — Telehealth: Payer: Self-pay | Admitting: *Deleted

## 2015-06-11 ENCOUNTER — Other Ambulatory Visit: Payer: Self-pay | Admitting: Pain Medicine

## 2015-06-11 NOTE — Telephone Encounter (Signed)
Call back complete

## 2015-06-19 ENCOUNTER — Ambulatory Visit: Payer: Medicare Other | Admitting: Pain Medicine

## 2015-08-06 ENCOUNTER — Other Ambulatory Visit: Payer: Self-pay | Admitting: Pain Medicine

## 2015-08-06 ENCOUNTER — Encounter: Payer: Self-pay | Admitting: Pain Medicine

## 2015-08-06 ENCOUNTER — Ambulatory Visit: Payer: Medicare Other | Attending: Pain Medicine | Admitting: Pain Medicine

## 2015-08-06 VITALS — BP 129/81 | HR 77 | Temp 98.5°F | Resp 16 | Ht 65.5 in | Wt 202.0 lb

## 2015-08-06 DIAGNOSIS — E039 Hypothyroidism, unspecified: Secondary | ICD-10-CM | POA: Diagnosis not present

## 2015-08-06 DIAGNOSIS — F419 Anxiety disorder, unspecified: Secondary | ICD-10-CM | POA: Insufficient documentation

## 2015-08-06 DIAGNOSIS — K219 Gastro-esophageal reflux disease without esophagitis: Secondary | ICD-10-CM | POA: Insufficient documentation

## 2015-08-06 DIAGNOSIS — F1190 Opioid use, unspecified, uncomplicated: Secondary | ICD-10-CM | POA: Diagnosis not present

## 2015-08-06 DIAGNOSIS — Z87891 Personal history of nicotine dependence: Secondary | ICD-10-CM | POA: Diagnosis not present

## 2015-08-06 DIAGNOSIS — M797 Fibromyalgia: Secondary | ICD-10-CM | POA: Insufficient documentation

## 2015-08-06 DIAGNOSIS — K589 Irritable bowel syndrome without diarrhea: Secondary | ICD-10-CM | POA: Diagnosis not present

## 2015-08-06 DIAGNOSIS — Z79891 Long term (current) use of opiate analgesic: Secondary | ICD-10-CM | POA: Diagnosis not present

## 2015-08-06 DIAGNOSIS — M549 Dorsalgia, unspecified: Secondary | ICD-10-CM | POA: Diagnosis present

## 2015-08-06 DIAGNOSIS — K449 Diaphragmatic hernia without obstruction or gangrene: Secondary | ICD-10-CM | POA: Insufficient documentation

## 2015-08-06 DIAGNOSIS — M47816 Spondylosis without myelopathy or radiculopathy, lumbar region: Secondary | ICD-10-CM | POA: Insufficient documentation

## 2015-08-06 DIAGNOSIS — M961 Postlaminectomy syndrome, not elsewhere classified: Secondary | ICD-10-CM | POA: Diagnosis not present

## 2015-08-06 DIAGNOSIS — E559 Vitamin D deficiency, unspecified: Secondary | ICD-10-CM | POA: Diagnosis not present

## 2015-08-06 DIAGNOSIS — M545 Low back pain: Secondary | ICD-10-CM | POA: Diagnosis not present

## 2015-08-06 DIAGNOSIS — M539 Dorsopathy, unspecified: Secondary | ICD-10-CM

## 2015-08-06 DIAGNOSIS — M79606 Pain in leg, unspecified: Secondary | ICD-10-CM | POA: Diagnosis present

## 2015-08-06 DIAGNOSIS — M5416 Radiculopathy, lumbar region: Secondary | ICD-10-CM

## 2015-08-06 DIAGNOSIS — M79605 Pain in left leg: Secondary | ICD-10-CM | POA: Diagnosis present

## 2015-08-06 DIAGNOSIS — Z5181 Encounter for therapeutic drug level monitoring: Secondary | ICD-10-CM | POA: Diagnosis not present

## 2015-08-06 DIAGNOSIS — G8929 Other chronic pain: Secondary | ICD-10-CM | POA: Diagnosis not present

## 2015-08-06 DIAGNOSIS — F329 Major depressive disorder, single episode, unspecified: Secondary | ICD-10-CM | POA: Insufficient documentation

## 2015-08-06 DIAGNOSIS — I10 Essential (primary) hypertension: Secondary | ICD-10-CM | POA: Insufficient documentation

## 2015-08-06 MED ORDER — HYDROCODONE-ACETAMINOPHEN 7.5-325 MG PO TABS: 1.0000 | ORAL_TABLET | Freq: Three times a day (TID) | ORAL | Status: DC | PRN

## 2015-08-06 MED ORDER — FENTANYL 75 MCG/HR TD PT72: 75.0000 ug | MEDICATED_PATCH | TRANSDERMAL | Status: DC

## 2015-08-06 MED ORDER — MELOXICAM 15 MG PO TABS: 15.0000 mg | ORAL_TABLET | Freq: Every day | ORAL | Status: DC

## 2015-08-06 MED ORDER — GABAPENTIN 300 MG PO CAPS: 300.0000 mg | ORAL_CAPSULE | Freq: Four times a day (QID) | ORAL | Status: DC

## 2015-08-06 NOTE — Progress Notes (Signed)
Patient here for medication refill.  States that she has had to take more of the gabapentin due to increased pain.  She is taking 2 tabs tid instead of 1 tab tid.  Hydrocodone qty 22.  Last refill 07/04/15. Fentanyl 2 patches.

## 2015-08-06 NOTE — Patient Instructions (Signed)
Epidural Steroid Injection Patient Information  Description: The epidural space surrounds the nerves as they exit the spinal cord.  In some patients, the nerves can be compressed and inflamed by a bulging disc or a tight spinal canal (spinal stenosis).  By injecting steroids into the epidural space, we can bring irritated nerves into direct contact with a potentially helpful medication.  These steroids act directly on the irritated nerves and can reduce swelling and inflammation which often leads to decreased pain.  Epidural steroids may be injected anywhere along the spine and from the neck to the low back depending upon the location of your pain.   After numbing the skin with local anesthetic (like Novocaine), a small needle is passed into the epidural space slowly.  You may experience a sensation of pressure while this is being done.  The entire block usually last less than 10 minutes.  Conditions which may be treated by epidural steroids:   Low back and leg pain  Neck and arm pain  Spinal stenosis  Post-laminectomy syndrome  Herpes zoster (shingles) pain  Pain from compression fractures  Preparation for the injection:  1. Do not eat any solid food or dairy products within 6 hours of your appointment.  2. You may drink clear liquids up to 2 hours before appointment.  Clear liquids include water, black coffee, juice or soda.  No milk or cream please. 3. You may take your regular medication, including pain medications, with a sip of water before your appointment  Diabetics should hold regular insulin (if taken separately) and take 1/2 normal NPH dos the morning of the procedure.  Carry some sugar containing items with you to your appointment. 4. A driver must accompany you and be prepared to drive you home after your procedure.  5. Bring all your current medications with your. 6. An IV may be inserted and sedation may be given at the discretion of the physician.   7. A blood pressure  cuff, EKG and other monitors will often be applied during the procedure.  Some patients may need to have extra oxygen administered for a short period. 8. You will be asked to provide medical information, including your allergies, prior to the procedure.  We must know immediately if you are taking blood thinners (like Coumadin/Warfarin)  Or if you are allergic to IV iodine contrast (dye). We must know if you could possible be pregnant.  Possible side-effects:  Bleeding from needle site  Infection (rare, may require surgery)  Nerve injury (rare)  Numbness & tingling (temporary)  Difficulty urinating (rare, temporary)  Spinal headache ( a headache worse with upright posture)  Light -headedness (temporary)  Pain at injection site (several days)  Decreased blood pressure (temporary)  Weakness in arm/leg (temporary)  Pressure sensation in back/neck (temporary)  Call if you experience:  Fever/chills associated with headache or increased back/neck pain.  Headache worsened by an upright position.  New onset weakness or numbness of an extremity below the injection site  Hives or difficulty breathing (go to the emergency room)  Inflammation or drainage at the infection site  Severe back/neck pain  Any new symptoms which are concerning to you  Please note:  Although the local anesthetic injected can often make your back or neck feel good for several hours after the injection, the pain will likely return.  It takes 3-7 days for steroids to work in the epidural space.  You may not notice any pain relief for at least that one week.    If effective, we will often do a series of three injections spaced 3-6 weeks apart to maximally decrease your pain.  After the initial series, we generally will wait several months before considering a repeat injection of the same type.  If you have any questions, please call [phone] [facility] R[facility] AND  COMPLICATIONS  What are the risk, side effects and possible complications? Generally speaking, most procedures are safe.  However, with any procedure there are risks, side effects, and the possibility of complications.  The risks and complications are dependent upon the sites that are lesioned, or the type of nerve block to be performed.  The closer the procedure is to the spine, the more serious the risks are.  Great care is taken when placing the radio frequency needles, block needles or lesioning probes, but sometimes complications can occur. 1. Infection: Any time there is an injection through the skin, there is a risk of infection.  This is why sterile conditions are used for these blocks.  There are four possible types of infection. 1. Localized skin infection. 2. Central Nervous System Infection-This can be in the form of Meningitis, which can be deadly. 3. Epidural Infections-This can be in the form of an epidural abscess, which can cause pressure inside of the spine, causing compression of the spinal cord with subsequent paralysis. This would require an emergency surgery to decompress, and there are no guarantees that the patient would recover from the paralysis. 4. Discitis-This is an infection of the intervertebral discs.  It occurs in about 1% of discography procedures.  It is difficult to treat and it may lead to surgery.        2. Pain: the needles have to go through skin and soft tissues, will cause soreness.       3. Damage to internal structures:  The nerves to be lesioned may be near blood vessels or    other nerves which can be potentially damaged.       4. Bleeding: Bleeding is more common if the patient is taking blood thinners such as  aspirin, Coumadin, Ticiid, Plavix, etc., or if he/she have some genetic predisposition  such as hemophilia. Bleeding into the spinal canal can cause compression of the spinal  cord with subsequent paralysis.  This would require an emergency surgery  to  decompress and there are no guarantees that the patient would recover from the  paralysis.       5. Pneumothorax:  Puncturing of a lung is a possibility, every time a needle is introduced in  the area of the chest or upper back.  Pneumothorax refers to free air around the  collapsed lung(s), inside of the thoracic cavity (chest cavity).  Another two possible  complications related to a similar event would include: Hemothorax and Chylothorax.   These are variations of the Pneumothorax, where instead of air around the collapsed  lung(s), you may have blood or chyle, respectively.       6. Spinal headaches: They may occur with any procedures in the area of the spine.       7. Persistent CSF (Cerebro-Spinal Fluid) leakage: This is a rare problem, but may occur  with prolonged intrathecal or epidural catheters either due to the formation of a fistulous  track or a dural tear.       8. Nerve damage: By working so close to the spinal cord, there is always a possibility of  nerve damage, which could be   as serious as a permanent spinal cord injury with  paralysis.       9. Death:  Although rare, severe deadly allergic reactions known as "Anaphylactic  reaction" can occur to any of the medications used.      10. Worsening of the symptoms:  We can always make thing worse.  What are the chances of something like this happening? Chances of any of this occuring are extremely low.  By statistics, you have more of a chance of getting killed in a motor vehicle accident: while driving to the hospital than any of the above occurring .  Nevertheless, you should be aware that they are possibilities.  In general, it is similar to taking a shower.  Everybody knows that you can slip, hit your head and get killed.  Does that mean that you should not shower again?  Nevertheless always keep in mind that statistics do not mean anything if you happen to be on the wrong side of them.  Even if a procedure has a 1 (one) in a 1,000,000  (million) chance of going wrong, it you happen to be that one..Also, keep in mind that by statistics, you have more of a chance of having something go wrong when taking medications.  Who should not have this procedure? If you are on a blood thinning medication (e.g. Coumadin, Plavix, see list of "Blood Thinners"), or if you have an active infection going on, you should not have the procedure.  If you are taking any blood thinners, please inform your physician.  How should I prepare for this procedure?  Do not eat or drink anything at least six hours prior to the procedure.  Bring a driver with you .  It cannot be a taxi.  Come accompanied by an adult that can drive you back, and that is strong enough to help you if your legs get weak or numb from the local anesthetic.  Take all of your medicines the morning of the procedure with just enough water to swallow them.  If you have diabetes, make sure that you are scheduled to have your procedure done first thing in the morning, whenever possible.  If you have diabetes, take only half of your insulin dose and notify our nurse that you have done so as soon as you arrive at the clinic.  If you are diabetic, but only take blood sugar pills (oral hypoglycemic), then do not take them on the morning of your procedure.  You may take them after you have had the procedure.  Do not take aspirin or any aspirin-containing medications, at least eleven (11) days prior to the procedure.  They may prolong bleeding.  Wear loose fitting clothing that may be easy to take off and that you would not mind if it got stained with Betadine or blood.  Do not wear any jewelry or perfume  Remove any nail coloring.  It will interfere with some of our monitoring equipment.  NOTE: Remember that this is not meant to be interpreted as a complete list of all possible complications.  Unforeseen problems may occur.  BLOOD THINNERS The following drugs contain aspirin or other  products, which can cause increased bleeding during surgery and should not be taken for 2 weeks prior to and 1 week after surgery.  If you should need take something for relief of minor pain, you may take acetaminophen which is found in Tylenol,m Datril, Anacin-3 and Panadol. It is not blood thinner. The products listed below   are.  Do not take any of the products listed below in addition to any listed on your instruction sheet.  A.P.C or A.P.C with Codeine Codeine Phosphate Capsules #3 Ibuprofen Ridaura  ABC compound Congesprin Imuran rimadil  Advil Cope Indocin Robaxisal  Alka-Seltzer Effervescent Pain Reliever and Antacid Coricidin or Coricidin-D  Indomethacin Rufen  Alka-Seltzer plus Cold Medicine Cosprin Ketoprofen S-A-C Tablets  Anacin Analgesic Tablets or Capsules Coumadin Korlgesic Salflex  Anacin Extra Strength Analgesic tablets or capsules CP-2 Tablets Lanoril Salicylate  Anaprox Cuprimine Capsules Levenox Salocol  Anexsia-D Dalteparin Magan Salsalate  Anodynos Darvon compound Magnesium Salicylate Sine-off  Ansaid Dasin Capsules Magsal Sodium Salicylate  Anturane Depen Capsules Marnal Soma  APF Arthritis pain formula Dewitt's Pills Measurin Stanback  Argesic Dia-Gesic Meclofenamic Sulfinpyrazone  Arthritis Bayer Timed Release Aspirin Diclofenac Meclomen Sulindac  Arthritis pain formula Anacin Dicumarol Medipren Supac  Analgesic (Safety coated) Arthralgen Diffunasal Mefanamic Suprofen  Arthritis Strength Bufferin Dihydrocodeine Mepro Compound Suprol  Arthropan liquid Dopirydamole Methcarbomol with Aspirin Synalgos  ASA tablets/Enseals Disalcid Micrainin Tagament  Ascriptin Doan's Midol Talwin  Ascriptin A/D Dolene Mobidin Tanderil  Ascriptin Extra Strength Dolobid Moblgesic Ticlid  Ascriptin with Codeine Doloprin or Doloprin with Codeine Momentum Tolectin  Asperbuf Duoprin Mono-gesic Trendar  Aspergum Duradyne Motrin or Motrin IB Triminicin  Aspirin plain, buffered or enteric  coated Durasal Myochrisine Trigesic  Aspirin Suppositories Easprin Nalfon Trillsate  Aspirin with Codeine Ecotrin Regular or Extra Strength Naprosyn Uracel  Atromid-S Efficin Naproxen Ursinus  Auranofin Capsules Elmiron Neocylate Vanquish  Axotal Emagrin Norgesic Verin  Azathioprine Empirin or Empirin with Codeine Normiflo Vitamin E  Azolid Emprazil Nuprin Voltaren  Bayer Aspirin plain, buffered or children's or timed BC Tablets or powders Encaprin Orgaran Warfarin Sodium  Buff-a-Comp Enoxaparin Orudis Zorpin  Buff-a-Comp with Codeine Equegesic Os-Cal-Gesic   Buffaprin Excedrin plain, buffered or Extra Strength Oxalid   Bufferin Arthritis Strength Feldene Oxphenbutazone   Bufferin plain or Extra Strength Feldene Capsules Oxycodone with Aspirin   Bufferin with Codeine Fenoprofen Fenoprofen Pabalate or Pabalate-SF   Buffets II Flogesic Panagesic   Buffinol plain or Extra Strength Florinal or Florinal with Codeine Panwarfarin   Buf-Tabs Flurbiprofen Penicillamine   Butalbital Compound Four-way cold tablets Penicillin   Butazolidin Fragmin Pepto-Bismol   Carbenicillin Geminisyn Percodan   Carna Arthritis Reliever Geopen Persantine   Carprofen Gold's salt Persistin   Chloramphenicol Goody's Phenylbutazone   Chloromycetin Haltrain Piroxlcam   Clmetidine heparin Plaquenil   Cllnoril Hyco-pap Ponstel   Clofibrate Hydroxy chloroquine Propoxyphen         Before stopping any of these medications, be sure to consult the physician who ordered them.  Some, such as Coumadin (Warfarin) are ordered to prevent or treat serious conditions such as "deep thrombosis", "pumonary embolisms", and other heart problems.  The amount of time that you may need off of the medication may also vary with the medication and the reason for which you were taking it.  If you are taking any of these medications, please make sure you notify your pain physician before you undergo any procedures.          

## 2015-08-06 NOTE — Assessment & Plan Note (Signed)
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 We have been managing this pain over time with palliative left-sided L2-3 lumbar epidural steroid injection with excellent results. Unfortunately, the duration of benefit is always about 2-3 months. The patient is well aware of the risks and possible complications of the chronic use of steroids.

## 2015-08-06 NOTE — Progress Notes (Signed)
Patient's Name: Lydia Hernandez MRN: [id number] DOB: [birth year] DOS: 08/06/2015  Primary Reason(s) for Visit: Encounter for Medication Management CC: Back Pain and Leg Pain   HPI:  Lydia Hernandez is a 60 y.o. year old, female patient, who returns today as an established patient. She has Chronic pain; Chronic pain syndrome; Chronic low back pain; Failed back surgical syndrome; Postlaminectomy syndrome, lumbar region; Chronic radicular lumbar pain (left leg); Long term current use of opiate analgesic; Long term prescription opiate use; Opiate use; Opiate dependence (Lydia Hernandez); Encounter for therapeutic drug level monitoring; Lumbar spondylosis; Epidural fibrosis; Lumbar facet syndrome, bilateral; Chronic sacroiliac joint pain (Bilateral); Chronic lower extremity pain (Bilateral); Essential hypertension; History of tobacco abuse; Generalized anxiety disorder; Depression; Hiatal hernia; GERD (gastroesophageal reflux disease); Irritable bowel syndrome; Chronic constipation; Therapeutic opioid-induced constipation (OIC); Hypothyroidism; Fibromyalgia; Musculoskeletal pain; Neurogenic pain; Neuropathic pain; Vitamin D insufficiency; Osteoporosis; Vitamin B12 deficiency; Chronic neck pain; Cervical spondylosis; Lumbar facet hypertrophy; 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 Her primarily concern today is the Back Pain and Leg Pain   The patient returns to the clinics today for pharmacological management of her chronic pain. In addition, she is again having more pain in the lower back. She is well aware of fall that long-term side effects of steroids but still wants to proceed with a repeat palliative lumbar epidural steroid injection under fluoroscopic guidance, as soon as possible. In the past we have been doing left sided L2-3 lumbar epidural steroid injections under fluoroscopic guidance and IV sedation  with excellent results. She wants the same repeated.  Reported Pain Score: 6  (last pain medicine this a.m. @ 0500), clinically she looks like a 2-3/10. Reported level is inconsistent with clinical obrservations. Pain Type: Chronic pain Pain Location: Back Pain Orientation: Lower Pain Descriptors / Indicators: Dull, Constant (upon rising, back is "catching" and as patient states, almost throws her and stops her movement. ) Pain Frequency: Constant  Date of Last Visit: 06/04/16 Service Provided on Last Visit: Procedure  Pharmacotherapy  Review:   Onset of action: Within expected pharmacological parameters Time to Peak effect: Timing and results are as within normal expected parameters Effectiveness: Described as relatively effective, allowing for increase in activities of daily living (ADL) % Relief: More than 50% Side-effects or Adverse reactions: None reported Duration of action: Within normal limits for medication [place] PMP: Compliant with practice rules and regulations UDS Results: Compliant UDS Interpretation: Patient appears to be compliant with practice rules and regulations Medication Assessment Form: Reviewed. Patient indicates being compliant with therapy Treatment compliance: Compliant Substance Use Disorder (SUD) Risk Level: Low Pharmacologic Plan: Continue therapy as is  Lab Work: Illicit Drugs No results found for: THCU, COCAINSCRNUR, PCPSCRNUR, MDMA, AMPHETMU, METHADONE, ETOH  Inflammation Markers No results found for: ESRSEDRATE, CRP  Renal Function No results found for: BUN, CREATININE, GFRAA, GFRNONAA  Hepatic Function No results found for: AST, ALT, ALBUMIN  Electrolytes No results found for: NA, K, CL, CALCIUM, MG  Procedure Assessment:   Procedure done on last visit: Left sided, palliative, L2-3 lumbar epidural steroid injection under fluoroscopic guidance and IV sedation. Side-effects or Adverse reactions: None reported Sedation: Procedure was  performed with sedation  Results: Ultra-Short Term Relief (First 1 hour after procedure): 100 %  Possibly the results is influenced by the pharmacodynamic effect of the local anesthetic, as well as that of the intravenous analgesics and/or sedatives, when used Short  Term Relief (Initial 4-6 hrs after procedure): 100 % Short-term relief confirms injected site to be the source of pain Long Term Relief :  (patient experienced pain relief for approx 6 weeks at about 70%) Long-term benefit would suggest an inflammatory etiology to the pain   Current Relief (Now):  10%  It has been almost 2 months since her last palliative treatment and by now it has worn off. Interpretation of Results: Therapy continues to be effective as a palliative treatment.  Allergies:  Lydia Hernandez has No Known Allergies.  Meds:  The patient has a current medication list which includes the following prescription(s): cetirizine, cyclobenzaprine, estrogen (conjugated)-medroxyprogesterone, fentanyl, furosemide, gabapentin, hydrochlorothiazide, hydrocodone-acetaminophen, meloxicam, pantoprazole, potassium chloride, fentanyl, fentanyl, hydrocodone-acetaminophen, and hydrocodone-acetaminophen.  ROS:  Constitutional: Afebrile, no chills, well hydrated and well nourished Gastrointestinal: negative Musculoskeletal:negative Neurological: negative Behavioral/Psych: negative  PFSH:  Medical:  Lydia Hernandez  has a past medical history of Hypertension; Chronic back pain; Anxiety; Depression; Hiatal hernia; Hypothyroidism; Fibromyalgia; GERD (gastroesophageal reflux disease); IBS (irritable bowel syndrome); Osteoarthritis; and History of tobacco abuse (05/23/2015). Family: family history includes Cancer in her father and mother. Surgical:  has past surgical history that includes Back surgery; Abdominal hysterectomy; Neck surgery; Ankle surgery (Right); Hernia repair; and Cholecystectomy. Tobacco:  reports that she has been smoking.  She does  not have any smokeless tobacco history on file. Alcohol:  reports that she does not drink alcohol. Drug:  reports that she does not use illicit drugs.  Physical Exam:  Vitals:  Today's Vitals   08/06/15 1015  BP: 129/81  Pulse: 77  Temp: 98.5 F (36.9 C)  TempSrc: Oral  Resp: 16  Height: 5' 5.5" (1.664 m)  Weight: 202 lb (91.627 kg)  SpO2: 100%  PainSc: 6     Calculated BMI: Body mass index is 33.09 kg/(m^2).  General appearance: alert, cooperative, appears stated age, mild distress and moderately obese Eyes: PERLA Respiratory: No evidence respiratory distress, no audible rales or ronchi and no use of accessory muscles of respiration  Cervical Spine Inspection: Normal anatomy Alignment: Symetrical Palpation: WNL ROM: Adequate  Upper Extremities Inspection: No gross anomalies detected ROM: Adequate Sensory: Normal Motor: 5/5 Pulses: Palpable  Thoracic Spine Inspection: No gross anomalies detected Alignment: Symetrical Palpation: WNL ROM: Adequate  Lumbar Spine Inspection: No gross anomalies detected. Evidence of prior back surgeries in the form of scar tissue can be seen. Alignment: Symetrical Palpation: WNL ROM: Decreased Gait: Antalgic (limping)  Lower Extremities Inspection: No gross anomalies detected ROM: Adequate Sensory: Normal Motor: 5/5 Pulses: Palpable  Assessment & Plan:  Primary Diagnosis & Pertinent Problem List: The primary encounter diagnosis was Chronic pain. Diagnoses of Encounter for therapeutic drug level monitoring, Long term current use of opiate analgesic, Failed back surgical syndrome, Chronic pain of lower extremity, unspecified laterality, Chronic low back pain, and Chronic radicular lumbar pain (left leg) were also pertinent to this visit.  Assessment: Chronic radicular lumbar pain (left leg) This is a chronic radiculopathy secondary to a her failed back surgery syndrome. We have been managing this pain over time with  palliative left-sided L2-3 lumbar epidural steroid injection with excellent results. Unfortunately, the duration of benefit is always about 2-3 months. The patient is well aware of the risks and possible complications of the chronic use of steroids.    Pharmacotherapy Orders: Meds ordered this encounter  Medications  . gabapentin (NEURONTIN) 300 MG capsule    Sig: Take 1 capsule (300 mg total) by mouth 4 (four) times daily. For  nerve pain.    Dispense:  120 capsule    Refill:  2    Do not place this medication, or any other prescription from our practice, on "Automatic Refill". Patient may have prescription filled one day early if pharmacy is closed on scheduled refill date.  . fentaNYL (DURAGESIC - DOSED MCG/HR) 75 MCG/HR    Sig: Place 1 patch (75 mcg total) onto the skin every 3 (three) days.    Dispense:  10 patch    Refill:  0    Do not place this medication, or any other prescription from our practice, on "Automatic Refill". Patient may have prescription filled one day early if pharmacy is closed on scheduled refill date. Do not fill until: 08/07/15 To last until: 09/06/15  . fentaNYL (DURAGESIC) 75 MCG/HR    Sig: Place 1 patch (75 mcg total) onto the skin every 3 (three) days.    Dispense:  10 patch    Refill:  0    Do not place this medication, or any other prescription from our practice, on "Automatic Refill". Patient may have prescription filled one day early if pharmacy is closed on scheduled refill date. Do not fill until: 09/06/15 To last until: 10/03/15  . fentaNYL (DURAGESIC) 75 MCG/HR    Sig: Place 1 patch (75 mcg total) onto the skin every 3 (three) days.    Dispense:  10 patch    Refill:  0    Do not place this medication, or any other prescription from our practice, on "Automatic Refill". Patient may have prescription filled one day early if pharmacy is closed on scheduled refill date. Do not fill until: 10/03/15 To last until: 11/02/15  . HYDROcodone-acetaminophen  (NORCO) 7.5-325 MG tablet    Sig: Take 1 tablet by mouth every 8 (eight) hours as needed for moderate pain or severe pain.    Dispense:  90 tablet    Refill:  0    Do not place this medication, or any other prescription from our practice, on "Automatic Refill". Patient may have prescription filled one day early if pharmacy is closed on scheduled refill date. Do not fill until: 08/07/15 To last until: 09/06/15  . HYDROcodone-acetaminophen (NORCO) 7.5-325 MG tablet    Sig: Take 1 tablet by mouth every 8 (eight) hours as needed for moderate pain or severe pain.    Dispense:  90 tablet    Refill:  0    Do not place this medication, or any other prescription from our practice, on "Automatic Refill". Patient may have prescription filled one day early if pharmacy is closed on scheduled refill date. Do not fill until: 09/06/15 To last until: 10/03/15  . HYDROcodone-acetaminophen (NORCO) 7.5-325 MG tablet    Sig: Take 1 tablet by mouth every 8 (eight) hours as needed for moderate pain or severe pain.    Dispense:  90 tablet    Refill:  0    Do not place this medication, or any other prescription from our practice, on "Automatic Refill". Patient may have prescription filled one day early if pharmacy is closed on scheduled refill date. Do not fill until: 10/03/15 To last until: 11/02/15  . meloxicam (MOBIC) 15 MG tablet    Sig: Take 1 tablet (15 mg total) by mouth daily.    Dispense:  30 tablet    Refill:  2    Do not place this medication, or any other prescription from our practice, on "Automatic Refill". Patient may have prescription filled one day  early if pharmacy is closed on scheduled refill date.    Lab-work & Procedure Orders: Orders Placed This Encounter  Procedures  . LUMBAR EPIDURAL STEROID INJECTION  . LUMBAR EPIDURAL STEROID INJECTION    Radiology Orders: None  Interventional Therapies:  1. Left L2-3 lumbar epidural steroid injection under fluoroscopic guidance and IV  sedation. (Palliative)  2. PRN procedure: Left-sided L2-3 lumbar epidural steroid injection under fluoroscopic (no sooner than every 2 months)    Administered Medications: Ms. Tarte had no medications administered during this visit.  Primary Care Physician: Gilford Rile, MD Location: [facility] Note by: Kathlen Brunswick. Dossie Arbour, M.D, DABA, DABAPM, DABPM, DABIPP, FIPP

## 2015-08-09 ENCOUNTER — Encounter: Payer: Self-pay | Admitting: Pain Medicine

## 2015-08-09 ENCOUNTER — Ambulatory Visit: Payer: Medicare Other | Attending: Pain Medicine | Admitting: Pain Medicine

## 2015-08-09 VITALS — BP 122/85 | HR 88 | Temp 98.0°F | Resp 16 | Ht 65.0 in | Wt 203.0 lb

## 2015-08-09 DIAGNOSIS — G8929 Other chronic pain: Secondary | ICD-10-CM | POA: Diagnosis not present

## 2015-08-09 DIAGNOSIS — K5909 Other constipation: Secondary | ICD-10-CM | POA: Diagnosis not present

## 2015-08-09 DIAGNOSIS — K219 Gastro-esophageal reflux disease without esophagitis: Secondary | ICD-10-CM | POA: Diagnosis not present

## 2015-08-09 DIAGNOSIS — M797 Fibromyalgia: Secondary | ICD-10-CM | POA: Diagnosis not present

## 2015-08-09 DIAGNOSIS — M545 Low back pain: Secondary | ICD-10-CM | POA: Diagnosis not present

## 2015-08-09 DIAGNOSIS — M4802 Spinal stenosis, cervical region: Secondary | ICD-10-CM | POA: Insufficient documentation

## 2015-08-09 DIAGNOSIS — M79606 Pain in leg, unspecified: Secondary | ICD-10-CM | POA: Diagnosis present

## 2015-08-09 DIAGNOSIS — Z760 Encounter for issue of repeat prescription: Secondary | ICD-10-CM | POA: Insufficient documentation

## 2015-08-09 DIAGNOSIS — I10 Essential (primary) hypertension: Secondary | ICD-10-CM | POA: Diagnosis not present

## 2015-08-09 DIAGNOSIS — K449 Diaphragmatic hernia without obstruction or gangrene: Secondary | ICD-10-CM | POA: Insufficient documentation

## 2015-08-09 DIAGNOSIS — M47816 Spondylosis without myelopathy or radiculopathy, lumbar region: Secondary | ICD-10-CM | POA: Insufficient documentation

## 2015-08-09 DIAGNOSIS — G9619 Other disorders of meninges, not elsewhere classified: Secondary | ICD-10-CM | POA: Insufficient documentation

## 2015-08-09 DIAGNOSIS — F1190 Opioid use, unspecified, uncomplicated: Secondary | ICD-10-CM | POA: Diagnosis not present

## 2015-08-09 DIAGNOSIS — F411 Generalized anxiety disorder: Secondary | ICD-10-CM | POA: Insufficient documentation

## 2015-08-09 DIAGNOSIS — E538 Deficiency of other specified B group vitamins: Secondary | ICD-10-CM | POA: Insufficient documentation

## 2015-08-09 DIAGNOSIS — M5416 Radiculopathy, lumbar region: Secondary | ICD-10-CM

## 2015-08-09 DIAGNOSIS — E559 Vitamin D deficiency, unspecified: Secondary | ICD-10-CM | POA: Insufficient documentation

## 2015-08-09 DIAGNOSIS — M961 Postlaminectomy syndrome, not elsewhere classified: Secondary | ICD-10-CM

## 2015-08-09 DIAGNOSIS — K589 Irritable bowel syndrome without diarrhea: Secondary | ICD-10-CM | POA: Diagnosis not present

## 2015-08-09 DIAGNOSIS — M47892 Other spondylosis, cervical region: Secondary | ICD-10-CM | POA: Diagnosis not present

## 2015-08-09 DIAGNOSIS — E039 Hypothyroidism, unspecified: Secondary | ICD-10-CM | POA: Insufficient documentation

## 2015-08-09 DIAGNOSIS — M47812 Spondylosis without myelopathy or radiculopathy, cervical region: Secondary | ICD-10-CM

## 2015-08-09 DIAGNOSIS — F329 Major depressive disorder, single episode, unspecified: Secondary | ICD-10-CM | POA: Insufficient documentation

## 2015-08-09 DIAGNOSIS — M549 Dorsalgia, unspecified: Secondary | ICD-10-CM | POA: Diagnosis present

## 2015-08-09 MED ORDER — TRIAMCINOLONE ACETONIDE 40 MG/ML IJ SUSP
INTRAMUSCULAR | Status: AC
  Administered 2015-08-09: 12:00:00
  Filled 2015-08-09: qty 1

## 2015-08-09 MED ORDER — LACTATED RINGERS IV SOLN: 1000.0000 mL | INTRAVENOUS | Status: AC

## 2015-08-09 MED ORDER — FENTANYL CITRATE (PF) 100 MCG/2ML IJ SOLN: 100.0000 ug | Freq: Once | INTRAMUSCULAR | Status: DC

## 2015-08-09 MED ORDER — TRIAMCINOLONE ACETONIDE 40 MG/ML IJ SUSP: 40.0000 mg | Freq: Once | INTRAMUSCULAR | Status: DC

## 2015-08-09 MED ORDER — LIDOCAINE HCL (PF) 1 % IJ SOLN: 10.0000 mL | Freq: Once | INTRAMUSCULAR | Status: DC

## 2015-08-09 MED ORDER — ROPIVACAINE HCL 2 MG/ML IJ SOLN: 2.0000 mL | Freq: Once | INTRAMUSCULAR | Status: DC

## 2015-08-09 MED ORDER — SODIUM CHLORIDE 0.9 % IJ SOLN
2.0000 mL | Freq: Once | INTRAMUSCULAR | Status: DC
Start: 2015-08-09 — End: 2015-10-23

## 2015-08-09 MED ORDER — FENTANYL CITRATE (PF) 100 MCG/2ML IJ SOLN
INTRAMUSCULAR | Status: AC
  Administered 2015-08-09: 100 ug via INTRAVENOUS
  Filled 2015-08-09: qty 2

## 2015-08-09 MED ORDER — LIDOCAINE HCL (PF) 1 % IJ SOLN
INTRAMUSCULAR | Status: AC
  Administered 2015-08-09: 12:00:00
  Filled 2015-08-09: qty 5

## 2015-08-09 MED ORDER — IOHEXOL 180 MG/ML  SOLN
INTRAMUSCULAR | Status: AC
  Administered 2015-08-09: 11:00:00
  Filled 2015-08-09: qty 20

## 2015-08-09 MED ORDER — ROPIVACAINE HCL 2 MG/ML IJ SOLN
INTRAMUSCULAR | Status: AC
  Administered 2015-08-09: 12:00:00
  Filled 2015-08-09: qty 10

## 2015-08-09 MED ORDER — MIDAZOLAM HCL 5 MG/5ML IJ SOLN
INTRAMUSCULAR | Status: AC
  Administered 2015-08-09: 4 mg via INTRAVENOUS
  Filled 2015-08-09: qty 5

## 2015-08-09 MED ORDER — IOHEXOL 180 MG/ML  SOLN: 10.0000 mL | Freq: Once | INTRAMUSCULAR | Status: DC | PRN

## 2015-08-09 MED ORDER — MIDAZOLAM HCL 5 MG/5ML IJ SOLN: 5.0000 mg | Freq: Once | INTRAMUSCULAR | Status: DC

## 2015-08-09 MED ORDER — SODIUM CHLORIDE 0.9 % IJ SOLN
INTRAMUSCULAR | Status: AC
  Administered 2015-08-09: 12:00:00
  Filled 2015-08-09: qty 10

## 2015-08-09 NOTE — Patient Instructions (Signed)
Smoking Cessation, Tips for Success If you are ready to quit smoking, congratulations! You have chosen to help yourself be healthier. Cigarettes bring nicotine, tar, carbon monoxide, and other irritants into your body. Your lungs, heart, and blood vessels will be able to work better without these poisons. There are many different ways to quit smoking. Nicotine gum, nicotine patches, a nicotine inhaler, or nicotine nasal spray can help with physical craving. Hypnosis, support groups, and medicines help break the habit of smoking. WHAT THINGS CAN I DO TO MAKE QUITTING EASIER?  Here are some tips to help you quit for good:  Pick a date when you will quit smoking completely. Tell all of your friends and family about your plan to quit on that date.  Do not try to slowly cut down on the number of cigarettes you are smoking. Pick a quit date and quit smoking completely starting on that day.  Throw away all cigarettes.   Clean and remove all ashtrays from your home, work, and car.  On a card, write down your reasons for quitting. Carry the card with you and read it when you get the urge to smoke.  Cleanse your body of nicotine. Drink enough water and fluids to keep your urine clear or pale yellow. Do this after quitting to flush the nicotine from your body.  Learn to predict your moods. Do not let a bad situation be your excuse to have a cigarette. Some situations in your life might tempt you into wanting a cigarette.  Never have "just one" cigarette. It leads to wanting another and another. Remind yourself of your decision to quit.  Change habits associated with smoking. If you smoked while driving or when feeling stressed, try other activities to replace smoking. Stand up when drinking your coffee. Brush your teeth after eating. Sit in a different chair when you read the paper. Avoid alcohol while trying to quit, and try to drink fewer caffeinated beverages. Alcohol and caffeine may urge you to  smoke.  Avoid foods and drinks that can trigger a desire to smoke, such as sugary or spicy foods and alcohol.  Ask people who smoke not to smoke around you.  Have something planned to do right after eating or having a cup of coffee. For example, plan to take a walk or exercise.  Try a relaxation exercise to calm you down and decrease your stress. Remember, you may be tense and nervous for the first 2 weeks after you quit, but this will pass.  Find new activities to keep your hands busy. Play with a pen, coin, or rubber band. Doodle or draw things on paper.  Brush your teeth right after eating. This will help cut down on the craving for the taste of tobacco after meals. You can also try mouthwash.   Use oral substitutes in place of cigarettes. Try using lemon drops, carrots, cinnamon sticks, or chewing gum. Keep them handy so they are available when you have the urge to smoke.  When you have the urge to smoke, try deep breathing.  Designate your home as a nonsmoking area.  If you are a heavy smoker, ask your health care provider about a prescription for nicotine chewing gum. It can ease your withdrawal from nicotine.  Reward yourself. Set aside the cigarette money you save and buy yourself something nice.  Look for support from others. Join a support group or smoking cessation program. Ask someone at home or at work to help you with your plan   to quit smoking.  Always ask yourself, "Do I need this cigarette or is this just a reflex?" Tell yourself, "Today, I choose not to smoke," or "I do not want to smoke." You are reminding yourself of your decision to quit.  Do not replace cigarette smoking with electronic cigarettes (commonly called e-cigarettes). The safety of e-cigarettes is unknown, and some may contain harmful chemicals.  If you relapse, do not give up! Plan ahead and think about what you will do the next time you get the urge to smoke. HOW WILL I FEEL WHEN I QUIT SMOKING? You  may have symptoms of withdrawal because your body is used to nicotine (the addictive substance in cigarettes). You may crave cigarettes, be irritable, feel very hungry, cough often, get headaches, or have difficulty concentrating. The withdrawal symptoms are only temporary. They are strongest when you first quit but will go away within 10-14 days. When withdrawal symptoms occur, stay in control. Think about your reasons for quitting. Remind yourself that these are signs that your body is healing and getting used to being without cigarettes. Remember that withdrawal symptoms are easier to treat than the major diseases that smoking can cause.  Even after the withdrawal is over, expect periodic urges to smoke. However, these cravings are generally short lived and will go away whether you smoke or not. Do not smoke! WHAT RESOURCES ARE AVAILABLE TO HELP ME QUIT SMOKING? Your health care provider can direct you to community resources or hospitals for support, which may include:  Group support.  Education.  Hypnosis.  Therapy.   This information is not intended to replace advice given to you by your health care provider. Make sure you discuss any questions you have with your health care provider.   Document Released: 04/04/2004 Document Revised: 07/28/2014 Document Reviewed: 12/23/2012 Elsevier Interactive Patient Education 2016 [employer]  

## 2015-08-09 NOTE — Progress Notes (Signed)
Safety precautions to be maintained throughout the outpatient stay will include: orient to surroundings, keep bed in low position, maintain call bell within reach at all times, provide assistance with transfer out of bed and ambulation.  

## 2015-08-09 NOTE — Progress Notes (Signed)
Patient's Name: Lydia Hernandez MRN: [id number] DOB: 1956-07-14 DOS: 08/09/2015  Primary Reason(s) for Visit: Interventional Pain Management Treatment. CC: Back Pain and Leg Pain   Pre-Procedure Assessment:  Lydia Hernandez is a 60 y.o. year old, female patient, seen today for interventional treatment. She has Chronic pain; Chronic pain syndrome; Chronic low back pain; Failed back surgical syndrome; Postlaminectomy syndrome, lumbar region; Chronic radicular lumbar pain (left leg); Long term current use of opiate analgesic; Long term prescription opiate use; Opiate use; Opiate dependence ([place]); Encounter for therapeutic drug level monitoring; Lumbar spondylosis; Epidural fibrosis; Lumbar facet syndrome, bilateral; Chronic sacroiliac joint pain (Bilateral); Chronic lower extremity pain (Bilateral); Essential hypertension; History of tobacco abuse; Generalized anxiety disorder; Depression; Hiatal hernia; GERD (gastroesophageal reflux disease); Irritable bowel syndrome; Chronic constipation; Therapeutic opioid-induced constipation (OIC); Hypothyroidism; Fibromyalgia; Musculoskeletal pain; Neurogenic pain; Neuropathic pain; Vitamin D insufficiency; Osteoporosis; Vitamin B12 deficiency; Chronic neck pain; Cervical spondylosis; Lumbar facet hypertrophy; 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 Her primarily concern today is the Back Pain and Leg Pain   Today's Pain Score: 0-No pain (3/10 at the time of arrival) Reported level of pain is compatible with clinical observation Pain Type: Chronic pain Pain Location: Back Pain Orientation: Lower Pain Descriptors / Indicators: Constant, Dull Pain Frequency: Constant  Date of Last Visit: 08/06/15 Service Provided on Last Visit: Med Refill  Verification of the correct person, correct site (including marking of site), and correct procedure were  performed and confirmed by the patient.  Today's Vitals   08/09/15 1134 08/09/15 1139 08/09/15 1150 08/09/15 1202  BP: 150/76 155/84 126/103 122/85  Pulse: 92 86 85 88  Temp:      Resp: '15 14 16 16  '$ Height:      Weight:      SpO2: 100% 100% 97% 98%  PainSc: 3  3   0-No pain  PainLoc:      Calculated BMI: Body mass index is 33.78 kg/(m^2). Allergies: She has No Known Allergies.. Primary Diagnosis: Chronic radicular lumbar pain [M54.16, G89.29]  Procedure:  Type: Palliative Inter-Laminar Epidural Steroid Injection Region: Lumbar Level: L2-3 Level. Laterality: Left-Sided Paramedial  Indications: 1. Chronic radicular lumbar pain (left leg)   2. Cervical spondylosis   3. Failed back surgical syndrome   4. 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  Consent: Secured. Under the influence of no sedatives a written informed consent was obtained, after having provided information on the risks and possible complications. To fulfill our ethical and legal obligations, as recommended by the American Medical Association's Code of Ethics, we have provided information to the patient about our clinical impression; the nature and purpose of the treatment or procedure; the risks, benefits, and possible complications of the intervention;  alternatives; the risk(s) and benefit(s) of the alternative treatment(s) or procedure(s); and the risk(s) and benefit(s) of doing nothing. The patient was provided information about the risks  and possible complications associated with the procedure. In the case of spinal procedures these may include, but are not limited to, failure to achieve desired goals, infection, bleeding, organ or nerve damage, allergic reactions, paralysis, and death. In addition, the patient was informed that Medicine is not an exact science; therefore, there is also the possibility of unforeseen risks and possible complications that may result in a catastrophic outcome. The patient indicated having understood very clearly. We have given the patient no guarantees and we have made no promises. Enough time was given to the patient to ask questions, all of which were answered to the patient's satisfaction.  Pre-Procedure Preparation: Safety Precautions: Allergies reviewed. Appropriate site, procedure, and patient were confirmed by following the Joint Commission's Universal Protocol (UP.01.01.01), in the form of a "Time Out". The patient was asked to confirm marked site and procedure, before commencing. The patient was asked about blood thinners, or active infections, both of which were denied. Patient was assessed for positional comfort and all pressure points were checked before starting procedure. Monitoring:  As per clinic protocol. Infection Control Precautions: Sterile technique used. Standard Universal Precautions were taken as recommended by the Department of [facility] for Disease Control and Prevention (CDC). Standard pre-surgical skin prep was conducted. Respiratory hygiene and cough etiquette was practiced. Hand hygiene observed. Safe injection practices and needle disposal techniques followed. SDV (single dose vial) medications used. Medications properly checked for expiration dates and contaminants. Personal  protective equipment (PPE) used: Surgical mask. Sterile double glove technique. Radiation resistant gloves. Sterile surgical gloves.  Anesthesia, Analgesia, Anxiolysis: Type: Moderate (Conscious) Sedation & Local Anesthesia Local Anesthetic: Lidocaine 1% Route: Intravenous (IV) IV Access: Secured Sedation: Meaningful verbal contact was maintained at all times during the procedure  Indication(s): Analgesia & Anxiolysis  Description of Procedure Process:  Time-out: "Time-out" completed before starting procedure, as per protocol. Position: Prone Target Area: For Epidural Steroid injections, the target area is the  interlaminar space, initially targeting the lower border of the superior vertebral body lamina. Approach: Posterior approach. Area Prepped: Entire Posterior Lumbosacral Region Prepping solution: ChloraPrep (2% chlorhexidine gluconate and 70% isopropyl alcohol) Safety Precautions: Aspiration looking for blood return was conducted prior to all injections. At no point did we inject any substances, as a needle was being advanced. No attempts were made at seeking any paresthesias. Safe injection practices and needle disposal techniques used. Medications properly checked for expiration dates. SDV (single dose vial) medications used.   Description of the Procedure: Protocol guidelines were followed. The patient was placed in position over the fluoroscopy table. The target area was identified and the area prepped in the usual manner. Skin desensitized using vapocoolant spray. Skin & deeper tissues infiltrated with local anesthetic. Appropriate amount of time allowed to pass for local anesthetics to take effect. The procedure needle was introduced through the skin, ipsilateral to the reported pain, and advanced to the target area. Bone was contacted and the needle walked caudad, until the lamina was cleared. The epidural space was identified using "loss-of-resistance technique" with 2-3 ml of PF-NaCl  (0.9% NSS), in a 5cc LOR glass syringe. Proper needle placement secured. Negative aspiration confirmed. Solution injected in intermittent fashion, asking for systemic symptoms every 0.5cc of injectate. The needles were then removed and the area cleansed, making sure to leave some of the prepping solution back to take advantage of its long term bactericidal properties. EBL: None Materials & Medications Used:  Needle(s) Used: 20g - 10cm, Tuohy-style epidural needle Medications Administered today:  We administered iohexol, lidocaine (PF), sodium chloride, ropivacaine (PF) 2 mg/ml (0.2%), fentaNYL, midazolam, and triamcinolone acetonide.Please see chart orders for dosing details.  Imaging Guidance:  Type of Imaging Technique: Fluoroscopy Guidance (Spinal) Indication(s): Assistance in needle guidance and placement for procedures requiring needle placement in or near specific anatomical locations not easily accessible without such assistance. Exposure Time: Please see nurses notes. Contrast: Before injecting any contrast, we confirmed that the patient did not have an allergy to iodine, shellfish, or radiological contrast. Once satisfactory needle placement was completed at the desired level, radiological contrast was injected. Injection was conducted under continuous fluoroscopic guidance. Injection of contrast accomplished without complications. See chart for type and volume of contrast used. Fluoroscopic Guidance: I was personally present in the fluoroscopy suite, where the patient was placed in position for the procedure, over the fluoroscopy-compatible table. Fluoroscopy was manipulated, using "Tunnel Vision Technique", to obtain the best possible view of the target area, on the affected side. Parallax error was corrected before commencing the procedure. A "direction-depth-direction" technique was used to introduce the needle under continuous pulsed fluoroscopic guidance. Once the target was reached,  antero-posterior, oblique, and lateral fluoroscopic projection views were taken to confirm needle placement in all planes. Permanently recorded images stored by scanning into EMR. Interpretation: Intraoperative imaging interpretation by performing Physician. Adequate needle placement confirmed. Adequate needle placement confirmed in AP, lateral, & Oblique Views. Appropriate spread of contrast to desired area. No evidence of afferent or efferent intravascular uptake. No intrathecal or subarachnoid spread observed. Spread is WNL. No evidence of epidural restrictions. Permanent hardcopy images in multiple planes scanned into the patient's record.  Antibiotics:  Type:  Antibiotics Given (last 72 hours)    None      Indication(s): No indications identified.  Post-operative Assessment:  Complications: No immediate post-treatment complications were observed. Relevant Post-operative Information:  Disposition: Return to clinic for follow-up evaluation. The patient tolerated the entire procedure well. A repeat set of vitals were taken after the procedure and the patient was kept under observation following institutional policy, for this procedure. Post-procedural neurological assessment was performed, showing return to baseline, prior to discharge. The patient was discharged home, once institutional criteria were met. The patient was provided with post-procedure discharge instructions, including a section on how to identify potential problems. Should any problems arise concerning this procedure, the patient was given instructions to immediately contact us, at any time, without hesitation. In any case, we plan to contact the patient by telephone for a follow-up status report regarding this interventional procedure. Comments:  No additional relevant information.  Primary Care Physician: Feliciana Rossetti, MD Location: [facility] Note by: Sydnee Levans. Laban Emperor, M.D, DABA, DABAPM, DABPM,  DABIPP, FIPP  Disclaimer:  Medicine is not an exact science. The only guarantee in medicine is that nothing is guaranteed. It is important to note that the decision to proceed with this intervention was based on the information collected from the patient. The Data and conclusions were drawn from the patient's questionnaire, the interview, and the physical examination. Because the information was provided in large part by the patient, it cannot be guaranteed that it has not been purposely or unconsciously manipulated. Every effort has been made to obtain as much relevant data as possible for this evaluation. It is important to note that the conclusions that lead to this procedure are derived in large part from the available data. Always take into account that the treatment will also be dependent on availability of resources and existing  treatment guidelines, considered by other Pain Management Practitioners as being common knowledge and practice, at the time of the intervention. For Medico-Legal purposes, it is also important to point out that variation in procedural techniques and pharmacological choices are the acceptable norm. The indications, contraindications, technique, and results of the above procedure should only be interpreted and judged by a Board-Certified Interventional Pain Specialist with extensive familiarity and expertise in the same exact procedure and technique. Attempts at providing opinions without similar or greater experience and expertise than that of the treating physician will be considered as inappropriate and unethical, and shall result in a formal complaint to the state medical board and applicable specialty societies.

## 2015-08-10 ENCOUNTER — Telehealth: Payer: Self-pay | Admitting: *Deleted

## 2015-08-10 NOTE — Telephone Encounter (Signed)
Phone call x 2 with no answer and no answering machine.

## 2015-08-11 LAB — TOXASSURE SELECT 13 (MW), URINE: PDF: 0

## 2015-08-22 ENCOUNTER — Encounter: Payer: Medicare Other | Admitting: Pain Medicine

## 2015-09-06 ENCOUNTER — Ambulatory Visit: Payer: Medicare Other | Admitting: Pain Medicine

## 2015-10-18 ENCOUNTER — Telehealth: Payer: Self-pay | Admitting: Pain Medicine

## 2015-10-18 NOTE — Telephone Encounter (Signed)
Patient hurting and wants to get procedure / need order put in if Dr. Dossie Arbour wants to sched procedure

## 2015-10-18 NOTE — Telephone Encounter (Signed)
Juliann Pulse called to nurses station asking about procedure for patient.  Upon reviewing orders, found a PRN order for [facility].  Informed Juliann Pulse.  Scheduled for [facility]ESI by K. Wood.

## 2015-10-23 ENCOUNTER — Encounter: Payer: Self-pay | Admitting: Pain Medicine

## 2015-10-23 ENCOUNTER — Ambulatory Visit: Payer: Medicare Other | Attending: Pain Medicine | Admitting: Pain Medicine

## 2015-10-23 VITALS — BP 125/74 | HR 74 | Temp 97.5°F | Resp 18 | Ht 65.0 in | Wt 203.0 lb

## 2015-10-23 DIAGNOSIS — E538 Deficiency of other specified B group vitamins: Secondary | ICD-10-CM | POA: Insufficient documentation

## 2015-10-23 DIAGNOSIS — Z981 Arthrodesis status: Secondary | ICD-10-CM | POA: Diagnosis not present

## 2015-10-23 DIAGNOSIS — E039 Hypothyroidism, unspecified: Secondary | ICD-10-CM | POA: Diagnosis not present

## 2015-10-23 DIAGNOSIS — M4802 Spinal stenosis, cervical region: Secondary | ICD-10-CM | POA: Diagnosis not present

## 2015-10-23 DIAGNOSIS — G9619 Other disorders of meninges, not elsewhere classified: Secondary | ICD-10-CM | POA: Insufficient documentation

## 2015-10-23 DIAGNOSIS — M961 Postlaminectomy syndrome, not elsewhere classified: Secondary | ICD-10-CM

## 2015-10-23 DIAGNOSIS — F329 Major depressive disorder, single episode, unspecified: Secondary | ICD-10-CM | POA: Insufficient documentation

## 2015-10-23 DIAGNOSIS — E559 Vitamin D deficiency, unspecified: Secondary | ICD-10-CM | POA: Insufficient documentation

## 2015-10-23 DIAGNOSIS — M79605 Pain in left leg: Secondary | ICD-10-CM | POA: Insufficient documentation

## 2015-10-23 DIAGNOSIS — K469 Unspecified abdominal hernia without obstruction or gangrene: Secondary | ICD-10-CM | POA: Diagnosis not present

## 2015-10-23 DIAGNOSIS — F411 Generalized anxiety disorder: Secondary | ICD-10-CM | POA: Insufficient documentation

## 2015-10-23 DIAGNOSIS — M79604 Pain in right leg: Secondary | ICD-10-CM | POA: Insufficient documentation

## 2015-10-23 DIAGNOSIS — K5903 Drug induced constipation: Secondary | ICD-10-CM | POA: Insufficient documentation

## 2015-10-23 DIAGNOSIS — M4726 Other spondylosis with radiculopathy, lumbar region: Secondary | ICD-10-CM | POA: Diagnosis not present

## 2015-10-23 DIAGNOSIS — K219 Gastro-esophageal reflux disease without esophagitis: Secondary | ICD-10-CM | POA: Diagnosis not present

## 2015-10-23 DIAGNOSIS — M810 Age-related osteoporosis without current pathological fracture: Secondary | ICD-10-CM | POA: Diagnosis not present

## 2015-10-23 DIAGNOSIS — I10 Essential (primary) hypertension: Secondary | ICD-10-CM | POA: Insufficient documentation

## 2015-10-23 DIAGNOSIS — D179 Benign lipomatous neoplasm, unspecified: Secondary | ICD-10-CM | POA: Insufficient documentation

## 2015-10-23 DIAGNOSIS — G8929 Other chronic pain: Secondary | ICD-10-CM | POA: Diagnosis not present

## 2015-10-23 DIAGNOSIS — M539 Dorsopathy, unspecified: Secondary | ICD-10-CM | POA: Diagnosis not present

## 2015-10-23 DIAGNOSIS — K449 Diaphragmatic hernia without obstruction or gangrene: Secondary | ICD-10-CM | POA: Insufficient documentation

## 2015-10-23 DIAGNOSIS — M5416 Radiculopathy, lumbar region: Secondary | ICD-10-CM

## 2015-10-23 DIAGNOSIS — M533 Sacrococcygeal disorders, not elsewhere classified: Secondary | ICD-10-CM | POA: Diagnosis not present

## 2015-10-23 DIAGNOSIS — M545 Low back pain: Secondary | ICD-10-CM

## 2015-10-23 DIAGNOSIS — Z87891 Personal history of nicotine dependence: Secondary | ICD-10-CM | POA: Diagnosis not present

## 2015-10-23 DIAGNOSIS — K589 Irritable bowel syndrome without diarrhea: Secondary | ICD-10-CM | POA: Diagnosis not present

## 2015-10-23 DIAGNOSIS — M47896 Other spondylosis, lumbar region: Secondary | ICD-10-CM | POA: Diagnosis not present

## 2015-10-23 DIAGNOSIS — M797 Fibromyalgia: Secondary | ICD-10-CM | POA: Insufficient documentation

## 2015-10-23 DIAGNOSIS — Z79891 Long term (current) use of opiate analgesic: Secondary | ICD-10-CM | POA: Insufficient documentation

## 2015-10-23 DIAGNOSIS — M549 Dorsalgia, unspecified: Secondary | ICD-10-CM | POA: Diagnosis present

## 2015-10-23 MED ORDER — LIDOCAINE HCL (PF) 1 % IJ SOLN: 10.0000 mL | Freq: Once | INTRAMUSCULAR | Status: DC

## 2015-10-23 MED ORDER — MIDAZOLAM HCL 5 MG/5ML IJ SOLN
INTRAMUSCULAR | Status: AC
  Administered 2015-10-23: 4 mg via INTRAVENOUS
  Filled 2015-10-23: qty 5

## 2015-10-23 MED ORDER — IOPAMIDOL (ISOVUE-M 200) INJECTION 41%: 10.0000 mL | Freq: Once | INTRAMUSCULAR | Status: DC | PRN

## 2015-10-23 MED ORDER — LIDOCAINE HCL (PF) 1 % IJ SOLN
INTRAMUSCULAR | Status: AC
  Administered 2015-10-23: 11:00:00
  Filled 2015-10-23: qty 5

## 2015-10-23 MED ORDER — FENTANYL CITRATE (PF) 100 MCG/2ML IJ SOLN
INTRAMUSCULAR | Status: AC
  Administered 2015-10-23: 100 ug via INTRAVENOUS
  Filled 2015-10-23: qty 2

## 2015-10-23 MED ORDER — ROPIVACAINE HCL 2 MG/ML IJ SOLN
INTRAMUSCULAR | Status: AC
  Administered 2015-10-23: 10:00:00
  Filled 2015-10-23: qty 10

## 2015-10-23 MED ORDER — TRIAMCINOLONE ACETONIDE 40 MG/ML IJ SUSP: 40.0000 mg | Freq: Once | INTRAMUSCULAR | Status: DC

## 2015-10-23 MED ORDER — IOPAMIDOL (ISOVUE-M 200) INJECTION 41%
INTRAMUSCULAR | Status: AC
  Administered 2015-10-23: 10:00:00
  Filled 2015-10-23: qty 10

## 2015-10-23 MED ORDER — LIDOCAINE HCL (PF) 1 % IJ SOLN
INTRAMUSCULAR | Status: AC
  Administered 2015-10-23: 10:00:00
  Filled 2015-10-23: qty 5

## 2015-10-23 MED ORDER — SODIUM CHLORIDE 0.9 % IJ SOLN
INTRAMUSCULAR | Status: AC
  Administered 2015-10-23: 10:00:00
  Filled 2015-10-23: qty 10

## 2015-10-23 MED ORDER — ROPIVACAINE HCL 2 MG/ML IJ SOLN: 2.0000 mL | Freq: Once | INTRAMUSCULAR | Status: DC

## 2015-10-23 MED ORDER — FENTANYL CITRATE (PF) 100 MCG/2ML IJ SOLN: 100.0000 ug | INTRAMUSCULAR | Status: DC

## 2015-10-23 MED ORDER — MIDAZOLAM HCL 5 MG/5ML IJ SOLN: 5.0000 mg | INTRAMUSCULAR | Status: DC

## 2015-10-23 MED ORDER — LACTATED RINGERS IV SOLN: 1000.0000 mL | INTRAVENOUS | Status: AC

## 2015-10-23 MED ORDER — SODIUM CHLORIDE 0.9% FLUSH: 2.0000 mL | Freq: Once | INTRAVENOUS | Status: DC

## 2015-10-23 MED ORDER — TRIAMCINOLONE ACETONIDE 40 MG/ML IJ SUSP
INTRAMUSCULAR | Status: AC
  Administered 2015-10-23: 10:00:00
  Filled 2015-10-23: qty 1

## 2015-10-23 NOTE — Progress Notes (Signed)
Patient's Name: Lydia Hernandez MRN: [id number] DOB: [birth year] DOS: 10/23/2015  Primary Reason(s) for Visit: Interventional Pain Management Treatment. CC: Back Pain   Pre-Procedure Assessment:  Ms. Freimuth is a 60 y.o. year old, female patient, seen today for interventional treatment. She has Chronic pain; Chronic pain syndrome; Chronic low back pain; Failed back surgical syndrome; Postlaminectomy syndrome, lumbar region; Chronic radicular lumbar pain (left leg); Long term current use of opiate analgesic; Long term prescription opiate use; Opiate use; Opiate dependence ([place]); Encounter for therapeutic drug level monitoring; Lumbar spondylosis; Epidural fibrosis; Lumbar facet syndrome, bilateral; Chronic sacroiliac joint pain (Bilateral); Chronic lower extremity pain (Bilateral); Essential hypertension; History of tobacco abuse; Generalized anxiety disorder; Depression; Hiatal hernia; GERD (gastroesophageal reflux disease); Irritable bowel syndrome; Chronic constipation; Therapeutic opioid-induced constipation (OIC); Hypothyroidism; Fibromyalgia; Musculoskeletal pain; Neurogenic pain; Neuropathic pain; Vitamin D insufficiency; Osteoporosis; Vitamin B12 deficiency; Chronic neck pain; Cervical spondylosis; Lumbar facet hypertrophy; 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 Her primarily concern today is the Back Pain   Today's Initial Pain Score: 7/10 Reported level of pain is incompatible with clinical obrservations. This may be secondary to a possible lack of understanding on how the pain scale works. Pain Type: Chronic pain Pain Location: Back Pain Orientation: Lower Pain Descriptors / Indicators: Aching, Constant, Sharp, Radiating Pain Frequency: Constant  Post-procedure Pain Score: 0-No pain  Date of Last Visit: 08/09/15 Service Provided on Last Visit: Procedure  (LESI)  Verification of the correct person, correct site (including marking of site), and correct procedure were performed and confirmed by the patient.  Today's Vitals   10/23/15 1035 10/23/15 1045 10/23/15 1055 10/23/15 1057  BP: 134/75 125/78 124/79 125/74  Pulse: 75 74 76 74  Temp: 96.9 F (36.1 C)   97.5 F (36.4 C)  TempSrc:      Resp: '12 17 17 18  ' Height:      Weight:      SpO2: 98% 95% 96% 94%  PainSc:    0-No pain  PainLoc:      Calculated BMI: Body mass index is 33.78 kg/(m^2). Allergies: She has No Known Allergies.. Primary Diagnosis: Chronic radicular lumbar pain [M54.16, G89.29]  Procedure:  Type: Palliative Inter-Laminar Epidural Steroid Injection Region: Lumbar Level: L2-3 Level. Laterality: Left Paramedial  Indications: 1. Chronic radicular lumbar pain (left leg)   2. Failed back surgical syndrome   3. Chronic low back pain   4. Osteoarthritis of spine with radiculopathy, lumbar region     In addition, Ms. 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  Consent: Secured. Under the influence of no sedatives a written informed consent was obtained, after having provided information on the risks and possible complications. To fulfill our ethical and legal obligations, as recommended by the American Medical Association's Code of Ethics, we have provided information to the  patient about our clinical impression; the nature and purpose of the treatment or procedure; the risks, benefits, and possible complications of the intervention;  alternatives; the risk(s) and benefit(s) of the alternative treatment(s) or procedure(s); and the risk(s) and benefit(s) of doing nothing. The patient was provided information about the risks and possible complications associated with the procedure. In the case of spinal procedures these may include, but are not limited to, failure to achieve desired goals, infection, bleeding, organ or nerve damage, allergic reactions, paralysis, and death. In addition, the patient was informed that Medicine is not an exact science; therefore, there is also the possibility of unforeseen risks and possible complications that may result in a catastrophic outcome. The patient indicated having understood very clearly. We have given the patient no guarantees and we have made no promises. Enough time was given to the patient to ask questions, all of which were answered to the patient's satisfaction.  Pre-Procedure Preparation: Safety Precautions: Allergies reviewed. Appropriate site, procedure, and patient were confirmed by following the Joint Commission's Universal Protocol (UP.01.01.01), in the form of a "Time Out". The patient was asked to confirm marked site and procedure, before commencing. The patient was asked about blood thinners, or active infections, both of which were denied. Patient was assessed for positional comfort and all pressure points were checked before starting procedure. Monitoring:  As per clinic protocol. Infection Control Precautions: Sterile technique used. Standard Universal Precautions were taken as recommended by the Department of [facility] for Disease Control and Prevention (CDC). Standard pre-surgical skin prep was conducted. Respiratory hygiene and cough etiquette was practiced. Hand hygiene observed. Safe injection practices and  needle disposal techniques followed. SDV (single dose vial) medications used. Medications properly checked for expiration dates and contaminants. Personal protective equipment (PPE) used: Surgical mask. Sterile double glove technique. Radiation resistant gloves. Sterile surgical gloves.  Anesthesia, Analgesia, Anxiolysis: Type: Moderate (Conscious) Sedation & Local Anesthesia Local Anesthetic: Lidocaine 1% Route: Intravenous (IV) IV Access: Secured Sedation: Meaningful verbal contact was maintained at all times during the procedure  Indication(s): Analgesia & Anxiolysis  Description of Procedure Process:  Time-out: "Time-out" completed before starting procedure, as per protocol. Position: Prone Target Area: For Epidural Steroid injections, the target area is the  interlaminar space, initially targeting the lower border of the superior vertebral body lamina. Approach: Posterior approach. Area Prepped: Entire Posterior Lumbosacral Region Prepping solution: ChloraPrep (2% chlorhexidine gluconate and 70% isopropyl alcohol) Safety Precautions: Aspiration looking for blood return was conducted prior to all injections. At no point did we inject any substances, as a needle was being advanced. No attempts were made at seeking any paresthesias. Safe injection practices and needle disposal techniques used. Medications properly checked for expiration dates. SDV (single dose vial) medications used.   Description of the Procedure: Protocol guidelines were followed. The patient was placed in position over the fluoroscopy table. The target area was identified and the area prepped in the usual manner. Skin desensitized using vapocoolant spray. Skin & deeper tissues infiltrated with local anesthetic. Appropriate amount of time allowed to pass for local anesthetics to take effect. The procedure needle was introduced through the skin, ipsilateral to the reported pain, and advanced to the target area. Bone was  contacted and the needle walked caudad, until the lamina was cleared. The epidural space was identified using "loss-of-resistance technique" with 2-3 ml of PF-NaCl (0.9% NSS), in a 5cc LOR glass syringe. Proper needle placement secured. Negative aspiration confirmed. Solution injected in intermittent fashion, asking for systemic symptoms every 0.5cc of injectate. The needles were then removed and the area cleansed, making sure to leave some of the prepping solution back  to take advantage of its long term bactericidal properties. EBL: None Materials & Medications Used:  Needle(s) Used: 20g - 10cm, Tuohy-style epidural needle Medication(s): Please see chart orders for medication and dosing details.  Imaging Guidance:  Type of Imaging Technique: Fluoroscopy Guidance (Spinal) Indication(s): Assistance in needle guidance and placement for procedures requiring needle placement in or near specific anatomical locations not easily accessible without such assistance. Exposure Time: Please see nurses notes. Contrast: Before injecting any contrast, we confirmed that the patient did not have an allergy to iodine, shellfish, or radiological contrast. Once satisfactory needle placement was completed at the desired level, radiological contrast was injected. Injection was conducted under continuous fluoroscopic guidance. Injection of contrast accomplished without complications. See chart for type and volume of contrast used. Fluoroscopic Guidance: I was personally present in the fluoroscopy suite, where the patient was placed in position for the procedure, over the fluoroscopy-compatible table. Fluoroscopy was manipulated, using "Tunnel Vision Technique", to obtain the best possible view of the target area, on the affected side. Parallax error was corrected before commencing the procedure. A "direction-depth-direction" technique was used to introduce the needle under continuous pulsed fluoroscopic guidance. Once the  target was reached, antero-posterior, oblique, and lateral fluoroscopic projection views were taken to confirm needle placement in all planes. Permanently recorded images stored by scanning into EMR. Interpretation: Intraoperative imaging interpretation by performing Physician. Adequate needle placement confirmed. Adequate needle placement confirmed in AP, lateral, & Oblique Views. Appropriate spread of contrast to desired area. No evidence of afferent or efferent intravascular uptake. No intrathecal or subarachnoid spread observed. Permanent hardcopy images in multiple planes scanned into the patient's record.  Antibiotic Prophylaxis:  Indication(s): No indications identified. Type:  Antibiotics Given (last 72 hours)    None       Post-operative Assessment:  Complications: No immediate post-treatment complications were observed. Relevant Post-operative Information:  Disposition: Return to clinic for follow-up evaluation. The patient tolerated the entire procedure well. A repeat set of vitals were taken after the procedure and the patient was kept under observation following institutional policy, for this procedure. Post-procedural neurological assessment was performed, showing return to baseline, prior to discharge. The patient was discharged home, once institutional criteria were met. The patient was provided with post-procedure discharge instructions, including a section on how to identify potential problems. Should any problems arise concerning this procedure, the patient was given instructions to immediately contact us, at any time, without hesitation. In any case, we plan to contact the patient by telephone for a follow-up status report regarding this interventional procedure. Comments:  No additional relevant information.  Medications administered during this visit: We administered ropivacaine (PF) 2 mg/ml (0.2%), iopamidol, lidocaine (PF), sodium chloride, fentaNYL, triamcinolone acetonide,  midazolam, and lidocaine (PF).  Prescriptions ordered during this visit: New Prescriptions   No medications on file    Future Appointments Date Time Provider [place]  10/31/2015 10:00 AM Milinda Pointer, MD [facility] None    Primary Care Physician: Gilford Rile, MD Location: [facility] Note by: Kathlen Brunswick. Dossie Arbour, M.D, DABA, DABAPM, DABPM, DABIPP, FIPP  Disclaimer:  Medicine is not an exact science. The only guarantee in medicine is that nothing is guaranteed. It is important to note that the decision to proceed with this intervention was based on the information collected from the patient. The Data and conclusions were drawn from the patient's questionnaire, the interview, and the physical examination. Because the information was provided in large part by the patient, it cannot be guaranteed  that it has not been purposely or unconsciously manipulated. Every effort has been made to obtain as much relevant data as possible for this evaluation. It is important to note that the conclusions that lead to this procedure are derived in large part from the available data. Always take into account that the treatment will also be dependent on availability of resources and existing treatment guidelines, considered by other Pain Management Practitioners as being common knowledge and practice, at the time of the intervention. For Medico-Legal purposes, it is also important to point out that variation in procedural techniques and pharmacological choices are the acceptable norm. The indications, contraindications, technique, and results of the above procedure should only be interpreted and judged by a Board-Certified Interventional Pain Specialist with extensive familiarity and expertise in the same exact procedure and technique. Attempts at providing opinions without similar or greater experience and expertise than that of the treating physician will be considered as  inappropriate and unethical, and shall result in a formal complaint to the state medical board and applicable specialty societies.

## 2015-10-23 NOTE — Patient Instructions (Signed)
Pain Management Discharge Instructions  General Discharge Instructions :  If you need to reach your doctor call: Monday-Friday 8:00 am - 4:00 pm at [phone] or toll free [phone].  After clinic hours [phone] to have operator reach doctor.  Bring all of your medication bottles to all your appointments in the pain clinic.  To cancel or reschedule your appointment with Pain Management please remember to call 24 hours in advance to avoid a fee.  Refer to the educational materials which you have been given on: General Risks, I had my Procedure. Discharge Instructions, Post Sedation.  Post Procedure Instructions:  The drugs you were given will stay in your system until tomorrow, so for the next 24 hours you should not drive, make any legal decisions or drink any alcoholic beverages.  You may eat anything you prefer, but it is better to start with liquids then soups and crackers, and gradually work up to solid foods.  Please notify your doctor immediately if you have any unusual bleeding, trouble breathing or pain that is not related to your normal pain.  Depending on the type of procedure that was done, some parts of your body may feel week and/or numb.  This usually clears up by tonight or the next day.  Walk with the use of an assistive device or accompanied by an adult for the 24 hours.  You may use ice on the affected area for the first 24 hours.  Put ice in a Ziploc bag and cover with a towel and place against area 15 minutes on 15 minutes off.  You may switch to heat after 24 hours.Epidural Steroid Injection An epidural steroid injection is given to relieve pain in your neck, back, or legs that is caused by the irritation or swelling of a nerve root. This procedure involves injecting a steroid and numbing medicine (anesthetic) into the epidural space. The epidural space is the space between the outer covering of your spinal cord and the bones that form your backbone  (vertebra).  LET YOUR HEALTH CARE PROVIDER KNOW ABOUT:   Any allergies you have.  All medicines you are taking, including vitamins, herbs, eye drops, creams, and over-the-counter medicines such as aspirin.  Previous problems you or members of your family have had with the use of anesthetics.  Any blood disorders or blood clotting disorders you have.  Previous surgeries you have had.  Medical conditions you have. RISKS AND COMPLICATIONS Generally, this is a safe procedure. However, as with any procedure, complications can occur. Possible complications of epidural steroid injection include:  Headache.  Bleeding.  Infection.  Allergic reaction to the medicines.  Damage to your nerves. The response to this procedure depends on the underlying cause of the pain and its duration. People who have long-term (chronic) pain are less likely to benefit from epidural steroids than are those people whose pain comes on strong and suddenly. BEFORE THE PROCEDURE   Ask your health care provider about changing or stopping your regular medicines. You may be advised to stop taking blood-thinning medicines a few days before the procedure.  You may be given medicines to reduce anxiety.  Arrange for someone to take you home after the procedure. PROCEDURE   You will remain awake during the procedure. You may receive medicine to make you relaxed.  You will be asked to lie on your stomach.  The injection site will be cleaned.  The injection site will be numbed with a medicine (local anesthetic).  A needle will be   injected through your skin into the epidural space.  Your health care provider will use an X-ray machine to ensure that the steroid is delivered closest to the affected nerve. You may have minimal discomfort at this time.  Once the needle is in the right position, the local anesthetic and the steroid will be injected into the epidural space.  The needle will then be removed and a  bandage will be applied to the injection site. AFTER THE PROCEDURE   You may be monitored for a short time before you go home.  You may feel weakness or numbness in your arm or leg, which disappears within hours.  You may be allowed to eat, drink, and take your regular medicine.  You may have soreness at the site of the injection.   This information is not intended to replace advice given to you by your health care provider. Make sure you discuss any questions you have with your health care provider.   Document Released: 10/14/2007 Document Revised: 03/09/2013 Document Reviewed: 12/24/2012 Else[employer] Interactive Patient Education 2016 [employer] GENERAL RISKS AND COMPLICATIONS  What are the risk, side effects and possible complications? Generally speaking, most procedures are safe.  However, with any procedure there are risks, side effects, and the possibility of complications.  The risks and complications are dependent upon the sites that are lesioned, or the type of nerve block to be performed.  The closer the procedure is to the spine, the more serious the risks are.  Great care is taken when placing the radio frequency needles, block needles or lesioning probes, but sometimes complications can occur.  Infection: Any time there is an injection through the skin, there is a risk of infection.  This is why sterile conditions are used for these blocks.  There are four possible types of infection.  Localized skin infection.  Central Nervous System Infection-This can be in the form of Meningitis, which can be deadly.  Epidural Infections-This can be in the form of an epidural abscess, which can cause pressure inside of the spine, causing compression of the spinal cord with subsequent paralysis. This would require an emergency surgery to decompress, and there are no guarantees that the patient would recover from the paralysis.  Discitis-This is an infection of the intervertebral discs.  It  occurs in about 1% of discography procedures.  It is difficult to treat and it may lead to surgery.        2. Pain: the needles have to go through skin and soft tissues, will cause soreness.       3. Damage to internal structures:  The nerves to be lesioned may be near blood vessels or    other nerves which can be potentially damaged.       4. Bleeding: Bleeding is more common if the patient is taking blood thinners such as  aspirin, Coumadin, Ticiid, Plavix, etc., or if he/she have some genetic predisposition  such as hemophilia. Bleeding into the spinal canal can cause compression of the spinal  cord with subsequent paralysis.  This would require an emergency surgery to  decompress and there are no guarantees that the patient would recover from the  paralysis.       5. Pneumothorax:  Puncturing of a lung is a possibility, every time a needle is introduced in  the area of the chest or upper back.  Pneumothorax refers to free air around the  collapsed lung(s), inside of the thoracic cavity (chest cavity).  Another two   possible  complications related to a similar event would include: Hemothorax and Chylothorax.   These are variations of the Pneumothorax, where instead of air around the collapsed  lung(s), you may have blood or chyle, respectively.       6. Spinal headaches: They may occur with any procedures in the area of the spine.       7. Persistent CSF (Cerebro-Spinal Fluid) leakage: This is a rare problem, but may occur  with prolonged intrathecal or epidural catheters either due to the formation of a fistulous  track or a dural tear.       8. Nerve damage: By working so close to the spinal cord, there is always a possibility of  nerve damage, which could be as serious as a permanent spinal cord injury with  paralysis.       9. Death:  Although rare, severe deadly allergic reactions known as "Anaphylactic  reaction" can occur to any of the medications used.      10. Worsening of the symptoms:  We can  always make thing worse.  What are the chances of something like this happening? Chances of any of this occuring are extremely low.  By statistics, you have more of a chance of getting killed in a motor vehicle accident: while driving to the hospital than any of the above occurring .  Nevertheless, you should be aware that they are possibilities.  In general, it is similar to taking a shower.  Everybody knows that you can slip, hit your head and get killed.  Does that mean that you should not shower again?  Nevertheless always keep in mind that statistics do not mean anything if you happen to be on the wrong side of them.  Even if a procedure has a 1 (one) in a 1,000,000 (million) chance of going wrong, it you happen to be that one..Also, keep in mind that by statistics, you have more of a chance of having something go wrong when taking medications.  Who should not have this procedure? If you are on a blood thinning medication (e.g. Coumadin, Plavix, see list of "Blood Thinners"), or if you have an active infection going on, you should not have the procedure.  If you are taking any blood thinners, please inform your physician.  How should I prepare for this procedure?  Do not eat or drink anything at least six hours prior to the procedure.  Bring a driver with you .  It cannot be a taxi.  Come accompanied by an adult that can drive you back, and that is strong enough to help you if your legs get weak or numb from the local anesthetic.  Take all of your medicines the morning of the procedure with just enough water to swallow them.  If you have diabetes, make sure that you are scheduled to have your procedure done first thing in the morning, whenever possible.  If you have diabetes, take only half of your insulin dose and notify our nurse that you have done so as soon as you arrive at the clinic.  If you are diabetic, but only take blood sugar pills (oral hypoglycemic), then do not take them on  the morning of your procedure.  You may take them after you have had the procedure.  Do not take aspirin or any aspirin-containing medications, at least eleven (11) days prior to the procedure.  They may prolong bleeding.  Wear loose fitting clothing that may be easy to take off   and that you would not mind if it got stained with Betadine or blood.  Do not wear any jewelry or perfume  Remove any nail coloring.  It will interfere with some of our monitoring equipment.  NOTE: Remember that this is not meant to be interpreted as a complete list of all possible complications.  Unforeseen problems may occur.  BLOOD THINNERS The following drugs contain aspirin or other products, which can cause increased bleeding during surgery and should not be taken for 2 weeks prior to and 1 week after surgery.  If you should need take something for relief of minor pain, you may take acetaminophen which is found in Tylenol,m Datril, Anacin-3 and Panadol. It is not blood thinner. The products listed below are.  Do not take any of the products listed below in addition to any listed on your instruction sheet.  A.P.C or A.P.C with Codeine Codeine Phosphate Capsules #3 Ibuprofen Ridaura  ABC compound Congesprin Imuran rimadil  Advil Cope Indocin Robaxisal  Alka-Seltzer Effervescent Pain Reliever and Antacid Coricidin or Coricidin-D  Indomethacin Rufen  Alka-Seltzer plus Cold Medicine Cosprin Ketoprofen S-A-C Tablets  Anacin Analgesic Tablets or Capsules Coumadin Korlgesic Salflex  Anacin Extra Strength Analgesic tablets or capsules CP-2 Tablets Lanoril Salicylate  Anaprox Cuprimine Capsules Levenox Salocol  Anexsia-D Dalteparin Magan Salsalate  Anodynos Darvon compound Magnesium Salicylate Sine-off  Ansaid Dasin Capsules Magsal Sodium Salicylate  Anturane Depen Capsules Marnal Soma  APF Arthritis pain formula Dewitt's Pills Measurin Stanback  Argesic Dia-Gesic Meclofenamic Sulfinpyrazone  Arthritis Bayer Timed  Release Aspirin Diclofenac Meclomen Sulindac  Arthritis pain formula Anacin Dicumarol Medipren Supac  Analgesic (Safety coated) Arthralgen Diffunasal Mefanamic Suprofen  Arthritis Strength Bufferin Dihydrocodeine Mepro Compound Suprol  Arthropan liquid Dopirydamole Methcarbomol with Aspirin Synalgos  ASA tablets/Enseals Disalcid Micrainin Tagament  Ascriptin Doan's Midol Talwin  Ascriptin A/D Dolene Mobidin Tanderil  Ascriptin Extra Strength Dolobid Moblgesic Ticlid  Ascriptin with Codeine Doloprin or Doloprin with Codeine Momentum Tolectin  Asperbuf Duoprin Mono-gesic Trendar  Aspergum Duradyne Motrin or Motrin IB Triminicin  Aspirin plain, buffered or enteric coated Durasal Myochrisine Trigesic  Aspirin Suppositories Easprin Nalfon Trillsate  Aspirin with Codeine Ecotrin Regular or Extra Strength Naprosyn Uracel  Atromid-S Efficin Naproxen Ursinus  Auranofin Capsules Elmiron Neocylate Vanquish  Axotal Emagrin Norgesic Verin  Azathioprine Empirin or Empirin with Codeine Normiflo Vitamin E  Azolid Emprazil Nuprin Voltaren  Bayer Aspirin plain, buffered or children's or timed BC Tablets or powders Encaprin Orgaran Warfarin Sodium  Buff-a-Comp Enoxaparin Orudis Zorpin  Buff-a-Comp with Codeine Equegesic Os-Cal-Gesic   Buffaprin Excedrin plain, buffered or Extra Strength Oxalid   Bufferin Arthritis Strength Feldene Oxphenbutazone   Bufferin plain or Extra Strength Feldene Capsules Oxycodone with Aspirin   Bufferin with Codeine Fenoprofen Fenoprofen Pabalate or Pabalate-SF   Buffets II Flogesic Panagesic   Buffinol plain or Extra Strength Florinal or Florinal with Codeine Panwarfarin   Buf-Tabs Flurbiprofen Penicillamine   Butalbital Compound Four-way cold tablets Penicillin   Butazolidin Fragmin Pepto-Bismol   Carbenicillin Geminisyn Percodan   Carna Arthritis Reliever Geopen Persantine   Carprofen Gold's salt Persistin   Chloramphenicol Goody's Phenylbutazone   Chloromycetin  [place] Piroxlcam   Clmetidine heparin Plaquenil   Cllnoril Hyco-pap Ponstel   Clofibrate Hydroxy chloroquine Propoxyphen         Before stopping any of these medications, be sure to consult the physician who ordered them.  Some, such as Coumadin (Warfarin) are ordered to prevent or treat serious conditions such as "deep thrombosis", "pumonary embolisms", and   other heart problems.  The amount of time that you may need off of the medication may also vary with the medication and the reason for which you were taking it.  If you are taking any of these medications, please make sure you notify your pain physician before you undergo any procedures.          

## 2015-10-23 NOTE — Progress Notes (Signed)
Safety precautions to be maintained throughout the outpatient stay will include: orient to surroundings, keep bed in low position, maintain call bell within reach at all times, provide assistance with transfer out of bed and ambulation.  

## 2015-10-24 ENCOUNTER — Telehealth: Payer: Self-pay | Admitting: *Deleted

## 2015-10-24 NOTE — Telephone Encounter (Signed)
No problems post procedure. 

## 2015-10-31 ENCOUNTER — Encounter: Payer: Medicare Other | Admitting: Pain Medicine

## 2015-11-01 ENCOUNTER — Encounter: Payer: Self-pay | Admitting: Pain Medicine

## 2015-11-01 ENCOUNTER — Ambulatory Visit: Payer: Medicare Other | Attending: Pain Medicine | Admitting: Pain Medicine

## 2015-11-01 VITALS — BP 136/79 | HR 72 | Temp 98.0°F | Resp 16 | Ht 65.0 in | Wt 203.0 lb

## 2015-11-01 DIAGNOSIS — Z5181 Encounter for therapeutic drug level monitoring: Secondary | ICD-10-CM

## 2015-11-01 DIAGNOSIS — M47896 Other spondylosis, lumbar region: Secondary | ICD-10-CM | POA: Insufficient documentation

## 2015-11-01 DIAGNOSIS — K5903 Drug induced constipation: Secondary | ICD-10-CM | POA: Diagnosis not present

## 2015-11-01 DIAGNOSIS — M961 Postlaminectomy syndrome, not elsewhere classified: Secondary | ICD-10-CM | POA: Insufficient documentation

## 2015-11-01 DIAGNOSIS — Z87891 Personal history of nicotine dependence: Secondary | ICD-10-CM | POA: Diagnosis not present

## 2015-11-01 DIAGNOSIS — F329 Major depressive disorder, single episode, unspecified: Secondary | ICD-10-CM | POA: Insufficient documentation

## 2015-11-01 DIAGNOSIS — Z981 Arthrodesis status: Secondary | ICD-10-CM | POA: Insufficient documentation

## 2015-11-01 DIAGNOSIS — K5909 Other constipation: Secondary | ICD-10-CM | POA: Diagnosis not present

## 2015-11-01 DIAGNOSIS — M797 Fibromyalgia: Secondary | ICD-10-CM | POA: Diagnosis not present

## 2015-11-01 DIAGNOSIS — M533 Sacrococcygeal disorders, not elsewhere classified: Secondary | ICD-10-CM | POA: Diagnosis not present

## 2015-11-01 DIAGNOSIS — M62838 Other muscle spasm: Secondary | ICD-10-CM | POA: Insufficient documentation

## 2015-11-01 DIAGNOSIS — K449 Diaphragmatic hernia without obstruction or gangrene: Secondary | ICD-10-CM | POA: Insufficient documentation

## 2015-11-01 DIAGNOSIS — E538 Deficiency of other specified B group vitamins: Secondary | ICD-10-CM | POA: Diagnosis not present

## 2015-11-01 DIAGNOSIS — M792 Neuralgia and neuritis, unspecified: Secondary | ICD-10-CM

## 2015-11-01 DIAGNOSIS — E039 Hypothyroidism, unspecified: Secondary | ICD-10-CM | POA: Insufficient documentation

## 2015-11-01 DIAGNOSIS — K219 Gastro-esophageal reflux disease without esophagitis: Secondary | ICD-10-CM | POA: Diagnosis not present

## 2015-11-01 DIAGNOSIS — D1779 Benign lipomatous neoplasm of other sites: Secondary | ICD-10-CM | POA: Diagnosis not present

## 2015-11-01 DIAGNOSIS — K589 Irritable bowel syndrome without diarrhea: Secondary | ICD-10-CM | POA: Insufficient documentation

## 2015-11-01 DIAGNOSIS — M545 Low back pain: Secondary | ICD-10-CM | POA: Diagnosis not present

## 2015-11-01 DIAGNOSIS — I10 Essential (primary) hypertension: Secondary | ICD-10-CM | POA: Diagnosis not present

## 2015-11-01 DIAGNOSIS — G8929 Other chronic pain: Secondary | ICD-10-CM | POA: Diagnosis not present

## 2015-11-01 DIAGNOSIS — E559 Vitamin D deficiency, unspecified: Secondary | ICD-10-CM | POA: Diagnosis not present

## 2015-11-01 DIAGNOSIS — Z9889 Other specified postprocedural states: Secondary | ICD-10-CM | POA: Insufficient documentation

## 2015-11-01 DIAGNOSIS — M542 Cervicalgia: Secondary | ICD-10-CM | POA: Insufficient documentation

## 2015-11-01 DIAGNOSIS — F411 Generalized anxiety disorder: Secondary | ICD-10-CM | POA: Diagnosis not present

## 2015-11-01 DIAGNOSIS — G9619 Other disorders of meninges, not elsewhere classified: Secondary | ICD-10-CM | POA: Diagnosis not present

## 2015-11-01 DIAGNOSIS — F1190 Opioid use, unspecified, uncomplicated: Secondary | ICD-10-CM

## 2015-11-01 DIAGNOSIS — T402X5A Adverse effect of other opioids, initial encounter: Secondary | ICD-10-CM

## 2015-11-01 DIAGNOSIS — M4726 Other spondylosis with radiculopathy, lumbar region: Secondary | ICD-10-CM

## 2015-11-01 DIAGNOSIS — M549 Dorsalgia, unspecified: Secondary | ICD-10-CM | POA: Diagnosis present

## 2015-11-01 DIAGNOSIS — Z79891 Long term (current) use of opiate analgesic: Secondary | ICD-10-CM | POA: Insufficient documentation

## 2015-11-01 DIAGNOSIS — M5416 Radiculopathy, lumbar region: Secondary | ICD-10-CM

## 2015-11-01 HISTORY — DX: Other muscle spasm: M62.838

## 2015-11-01 MED ORDER — HYDROCODONE-ACETAMINOPHEN 7.5-325 MG PO TABS: 1.0000 | ORAL_TABLET | Freq: Three times a day (TID) | ORAL | Status: DC | PRN

## 2015-11-01 MED ORDER — LUBIPROSTONE 24 MCG PO CAPS: 24.0000 ug | ORAL_CAPSULE | Freq: Two times a day (BID) | ORAL | Status: DC

## 2015-11-01 MED ORDER — NALOXONE HCL 4 MG/0.1ML NA LIQD: 1.0000 | Freq: Once | NASAL | Status: DC

## 2015-11-01 MED ORDER — FENTANYL 75 MCG/HR TD PT72: 75.0000 ug | MEDICATED_PATCH | TRANSDERMAL | Status: DC

## 2015-11-01 MED ORDER — BENEFIBER PO POWD: ORAL | Status: DC

## 2015-11-01 MED ORDER — CYCLOBENZAPRINE HCL 10 MG PO TABS: 10.0000 mg | ORAL_TABLET | Freq: Three times a day (TID) | ORAL | Status: DC | PRN

## 2015-11-01 MED ORDER — GABAPENTIN 300 MG PO CAPS: 300.0000 mg | ORAL_CAPSULE | Freq: Four times a day (QID) | ORAL | Status: DC

## 2015-11-01 MED ORDER — MELOXICAM 15 MG PO TABS: 15.0000 mg | ORAL_TABLET | Freq: Every day | ORAL | Status: DC

## 2015-11-01 NOTE — Patient Instructions (Addendum)
Naloxone nasal spray What is this medicine? NALOXONE (nal OX one) is a narcotic blocker. It is used to treat narcotic drug overdose. This medicine may be used for other purposes; ask your health care provider or pharmacist if you have questions. What should I tell my health care provider before I take this medicine? They need to know if you have any of these conditions: -drug abuse or addiction -heart disease -an unusual or allergic reaction to naloxone, other medicines, foods, dyes, or preservatives -pregnant or trying to get pregnant -breast-feeding How should I use this medicine? This medicine is for use in the nose. Lay the person on their back. Support their neck with your hand and allow the head to tilt back before giving the medicine. The nasal spray should be given into 1 nostril. After giving the medicine, move the person onto their side. Do not remove or test the nasal spray until ready to use. Get emergency medical help right away after giving the first dose of this medicine, even if the person wakes up. You should be familiar with how to recognize the signs and symptoms of a narcotic overdose. If more doses are needed, give the additional dose in the other nostril. Talk to your pediatrician regarding the use of this medicine in children. While this drug may be prescribed for children as young as newborns for selected conditions, precautions do apply. Overdosage: If you think you have taken too much of this medicine contact a poison control center or emergency room at once. NOTE: This medicine is only for you. Do not share this medicine with others. What if I miss a dose? This does not apply. What may interact with this medicine? This medicine is only used during an emergency. No interactions are expected during emergency use. This list may not describe all possible interactions. Give your health care provider a list of all the medicines, herbs, non-prescription drugs, or dietary  supplements you use. Also tell them if you smoke, drink alcohol, or use illegal drugs. Some items may interact with your medicine. What should I watch for while using this medicine? Keep this medicine ready for use in the case of a narcotic overdose. Make sure that you have the phone number of your doctor or health care professional and local hospital ready. You may need to have additional doses of this medicine. Each nasal spray contains a single dose. Some emergencies may require additional doses. After use, bring the treated person to the nearest hospital or call 911. Make sure the treating health care professional knows that the person has received an injection of this medicine. You will receive additional instructions on what to do during and after use of this medicine before an emergency occurs. What side effects may I notice from receiving this medicine? Side effects that you should report to your doctor or health care professional as soon as possible: -allergic reactions like skin rash, itching or hives, swelling of the face, lips, or tongue -breathing problems -fast, irregular heartbeat -high blood pressure -pain that was controlled by narcotic pain medicine -seizures Side effects that usually do not require medical attention (report these to your doctor or health care professional if they continue or are bothersome): -anxious -chills -diarrhea -fever -headache -muscle pain -nausea, vomiting -nose irritation like dryness, congestion, and swelling -sweating This list may not describe all possible side effects. Call your doctor for medical advice about side effects. You may report side effects to FDA at 1-800-FDA-1088. Where should I keep my   medicine? Keep out of the reach of children. Store between 4 and 40 degrees C (39 and 104 degrees F). Do not freeze. Throw away any unused medicine after the expiration date. Keep in original box until ready to use. NOTE: This sheet is a summary.  It may not cover all possible information. If you have questions about this medicine, talk to your doctor, pharmacist, or health care provider.    2016, [employer]. (2014-06-23 14:24:03) GENERAL RISKS AND COMPLICATIONS  What are the risk, side effects and possible complications? Generally speaking, most procedures are safe.  However, with any procedure there are risks, side effects, and the possibility of complications.  The risks and complications are dependent upon the sites that are lesioned, or the type of nerve block to be performed.  The closer the procedure is to the spine, the more serious the risks are.  Great care is taken when placing the radio frequency needles, block needles or lesioning probes, but sometimes complications can occur. 1. Infection: Any time there is an injection through the skin, there is a risk of infection.  This is why sterile conditions are used for these blocks.  There are four possible types of infection. 1. Localized skin infection. 2. Central Nervous System Infection-This can be in the form of Meningitis, which can be deadly. 3. Epidural Infections-This can be in the form of an epidural abscess, which can cause pressure inside of the spine, causing compression of the spinal cord with subsequent paralysis. This would require an emergency surgery to decompress, and there are no guarantees that the patient would recover from the paralysis. 4. Discitis-This is an infection of the intervertebral discs.  It occurs in about 1% of discography procedures.  It is difficult to treat and it may lead to surgery.        2. Pain: the needles have to go through skin and soft tissues, will cause soreness.       3. Damage to internal structures:  The nerves to be lesioned may be near blood vessels or    other nerves which can be potentially damaged.       4. Bleeding: Bleeding is more common if the patient is taking blood thinners such as  aspirin, Coumadin, Ticiid,  Plavix, etc., or if he/she have some genetic predisposition  such as hemophilia. Bleeding into the spinal canal can cause compression of the spinal  cord with subsequent paralysis.  This would require an emergency surgery to  decompress and there are no guarantees that the patient would recover from the  paralysis.       5. Pneumothorax:  Puncturing of a lung is a possibility, every time a needle is introduced in  the area of the chest or upper back.  Pneumothorax refers to free air around the  collapsed lung(s), inside of the thoracic cavity (chest cavity).  Another two possible  complications related to a similar event would include: Hemothorax and Chylothorax.   These are variations of the Pneumothorax, where instead of air around the collapsed  lung(s), you may have blood or chyle, respectively.       6. Spinal headaches: They may occur with any procedures in the area of the spine.       7. Persistent CSF (Cerebro-Spinal Fluid) leakage: This is a rare problem, but may occur  with prolonged intrathecal or epidural catheters either due to the formation of a fistulous  track or a dural tear.       8.  Nerve damage: By working so close to the spinal cord, there is always a possibility of  nerve damage, which could be as serious as a permanent spinal cord injury with  paralysis.       9. Death:  Although rare, severe deadly allergic reactions known as "Anaphylactic  reaction" can occur to any of the medications used.      10. Worsening of the symptoms:  We can always make thing worse.  What are the chances of something like this happening? Chances of any of this occuring are extremely low.  By statistics, you have more of a chance of getting killed in a motor vehicle accident: while driving to the hospital than any of the above occurring .  Nevertheless, you should be aware that they are possibilities.  In general, it is similar to taking a shower.  Everybody knows that you can slip, hit your head and get  killed.  Does that mean that you should not shower again?  Nevertheless always keep in mind that statistics do not mean anything if you happen to be on the wrong side of them.  Even if a procedure has a 1 (one) in a 1,000,000 (million) chance of going wrong, it you happen to be that one..Also, keep in mind that by statistics, you have more of a chance of having something go wrong when taking medications.  Who should not have this procedure? If you are on a blood thinning medication (e.g. Coumadin, Plavix, see list of "Blood Thinners"), or if you have an active infection going on, you should not have the procedure.  If you are taking any blood thinners, please inform your physician.  How should I prepare for this procedure?  Do not eat or drink anything at least six hours prior to the procedure.  Bring a driver with you .  It cannot be a taxi.  Come accompanied by an adult that can drive you back, and that is strong enough to help you if your legs get weak or numb from the local anesthetic.  Take all of your medicines the morning of the procedure with just enough water to swallow them.  If you have diabetes, make sure that you are scheduled to have your procedure done first thing in the morning, whenever possible.  If you have diabetes, take only half of your insulin dose and notify our nurse that you have done so as soon as you arrive at the clinic.  If you are diabetic, but only take blood sugar pills (oral hypoglycemic), then do not take them on the morning of your procedure.  You may take them after you have had the procedure.  Do not take aspirin or any aspirin-containing medications, at least eleven (11) days prior to the procedure.  They may prolong bleeding.  Wear loose fitting clothing that may be easy to take off and that you would not mind if it got stained with Betadine or blood.  Do not wear any jewelry or perfume  Remove any nail coloring.  It will interfere with some of our  monitoring equipment.  NOTE: Remember that this is not meant to be interpreted as a complete list of all possible complications.  Unforeseen problems may occur.  BLOOD THINNERS The following drugs contain aspirin or other products, which can cause increased bleeding during surgery and should not be taken for 2 weeks prior to and 1 week after surgery.  If you should need take something for relief of minor pain,  you may take acetaminophen which is found in Tylenol,m Datril, Anacin-3 and Panadol. It is not blood thinner. The products listed below are.  Do not take any of the products listed below in addition to any listed on your instruction sheet.  A.P.C or A.P.C with Codeine Codeine Phosphate Capsules #3 Ibuprofen Ridaura  ABC compound Congesprin Imuran rimadil  Advil Cope Indocin Robaxisal  Alka-Seltzer Effervescent Pain Reliever and Antacid Coricidin or Coricidin-D  Indomethacin Rufen  Alka-Seltzer plus Cold Medicine Cosprin Ketoprofen S-A-C Tablets  Anacin Analgesic Tablets or Capsules Coumadin Korlgesic Salflex  Anacin Extra Strength Analgesic tablets or capsules CP-2 Tablets Lanoril Salicylate  Anaprox Cuprimine Capsules Levenox Salocol  Anexsia-D Dalteparin Magan Salsalate  Anodynos Darvon compound Magnesium Salicylate Sine-off  Ansaid Dasin Capsules Magsal Sodium Salicylate  Anturane Depen Capsules Marnal Soma  APF Arthritis pain formula Dewitt's Pills Measurin Stanback  Argesic Dia-Gesic Meclofenamic Sulfinpyrazone  Arthritis Bayer Timed Release Aspirin Diclofenac Meclomen Sulindac  Arthritis pain formula Anacin Dicumarol Medipren Supac  Analgesic (Safety coated) Arthralgen Diffunasal Mefanamic Suprofen  Arthritis Strength Bufferin Dihydrocodeine Mepro Compound Suprol  Arthropan liquid Dopirydamole Methcarbomol with Aspirin Synalgos  ASA tablets/Enseals Disalcid Micrainin Tagament  Ascriptin Doan's Midol Talwin  Ascriptin A/D Dolene Mobidin Tanderil  Ascriptin Extra Strength  Dolobid Moblgesic Ticlid  Ascriptin with Codeine Doloprin or Doloprin with Codeine Momentum Tolectin  Asperbuf Duoprin Mono-gesic Trendar  Aspergum Duradyne Motrin or Motrin IB Triminicin  Aspirin plain, buffered or enteric coated Durasal Myochrisine Trigesic  Aspirin Suppositories Easprin Nalfon Trillsate  Aspirin with Codeine Ecotrin Regular or Extra Strength Naprosyn Uracel  Atromid-S Efficin Naproxen Ursinus  Auranofin Capsules Elmiron Neocylate Vanquish  Axotal Emagrin Norgesic Verin  Azathioprine Empirin or Empirin with Codeine Normiflo Vitamin E  Azolid Emprazil Nuprin Voltaren  Bayer Aspirin plain, buffered or children's or timed BC Tablets or powders Encaprin Orgaran Warfarin Sodium  Buff-a-Comp Enoxaparin Orudis Zorpin  Buff-a-Comp with Codeine Equegesic Os-Cal-Gesic   Buffaprin Excedrin plain, buffered or Extra Strength Oxalid   Bufferin Arthritis Strength Feldene Oxphenbutazone   Bufferin plain or Extra Strength Feldene Capsules Oxycodone with Aspirin   Bufferin with Codeine Fenoprofen Fenoprofen Pabalate or Pabalate-SF   Buffets II Flogesic Panagesic   Buffinol plain or Extra Strength Florinal or Florinal with Codeine Panwarfarin   Buf-Tabs Flurbiprofen Penicillamine   Butalbital Compound Four-way cold tablets Penicillin   Butazolidin Fragmin Pepto-Bismol   Carbenicillin Geminisyn Percodan   Carna Arthritis Reliever Geopen Persantine   Carprofen Gold's salt Persistin   Chloramphenicol Goody's Phenylbutazone   Chloromycetin Haltrain Piroxlcam   Clmetidine heparin Plaquenil   Cllnoril Hyco-pap Ponstel   Clofibrate Hydroxy chloroquine Propoxyphen         Before stopping any of these medications, be sure to consult the physician who ordered them.  Some, such as Coumadin (Warfarin) are ordered to prevent or treat serious conditions such as "deep thrombosis", "pumonary embolisms", and other heart problems.  The amount of time that you may need off of the medication may also  vary with the medication and the reason for which you were taking it.  If you are taking any of these medications, please make sure you notify your pain physician before you undergo any procedures.         Epidural Steroid Injection Patient Information  Description: The epidural space surrounds the nerves as they exit the spinal cord.  In some patients, the nerves can be compressed and inflamed by a bulging disc or a tight spinal canal (spinal stenosis).  By injecting steroids  into the epidural space, we can bring irritated nerves into direct contact with a potentially helpful medication.  These steroids act directly on the irritated nerves and can reduce swelling and inflammation which often leads to decreased pain.  Epidural steroids may be injected anywhere along the spine and from the neck to the low back depending upon the location of your pain.   After numbing the skin with local anesthetic (like Novocaine), a small needle is passed into the epidural space slowly.  You may experience a sensation of pressure while this is being done.  The entire block usually last less than 10 minutes.  Conditions which may be treated by epidural steroids:   Low back and leg pain  Neck and arm pain  Spinal stenosis  Post-laminectomy syndrome  Herpes zoster (shingles) pain  Pain from compression fractures  Preparation for the injection:  1. Do not eat any solid food or dairy products within 8 hours of your appointment.  2. You may drink clear liquids up to 3 hours before appointment.  Clear liquids include water, black coffee, juice or soda.  No milk or cream please. 3. You may take your regular medication, including pain medications, with a sip of water before your appointment  Diabetics should hold regular insulin (if taken separately) and take 1/2 normal NPH dos the morning of the procedure.  Carry some sugar containing items with you to your appointment. 4. A driver must accompany you and  be prepared to drive you home after your procedure.  5. Bring all your current medications with your. 6. An IV may be inserted and sedation may be given at the discretion of the physician.   7. A blood pressure cuff, EKG and other monitors will often be applied during the procedure.  Some patients may need to have extra oxygen administered for a short period. 8. You will be asked to provide medical information, including your allergies, prior to the procedure.  We must know immediately if you are taking blood thinners (like Coumadin/Warfarin)  Or if you are allergic to IV iodine contrast (dye). We must know if you could possible be pregnant.  Possible side-effects:  Bleeding from needle site  Infection (rare, may require surgery)  Nerve injury (rare)  Numbness & tingling (temporary)  Difficulty urinating (rare, temporary)  Spinal headache ( a headache worse with upright posture)  Light -headedness (temporary)  Pain at injection site (several days)  Decreased blood pressure (temporary)  Weakness in arm/leg (temporary)  Pressure sensation in back/neck (temporary)  Call if you experience:  Fever/chills associated with headache or increased back/neck pain.  Headache worsened by an upright position.  New onset weakness or numbness of an extremity below the injection site  Hives or difficulty breathing (go to the emergency room)  Inflammation or drainage at the infection site  Severe back/neck pain  Any new symptoms which are concerning to you  Please note:  Although the local anesthetic injected can often make your back or neck feel good for several hours after the injection, the pain will likely return.  It takes 3-7 days for steroids to work in the epidural space.  You may not notice any pain relief for at least that one week.  If effective, we will often do a series of three injections spaced 3-6 weeks apart to maximally decrease your pain.  After the initial series,  we generally will wait several months before considering a repeat injection of the same type.  If you have  any questions, please call [phone] [facility]

## 2015-11-01 NOTE — Progress Notes (Signed)
Patient's Name: Lydia Hernandez  Patient type: Established  MRN: [id number]  Service setting: Ambulatory outpatient  DOB: 1956/02/10  Location: [facility]  DOS: 11/01/2015  Primary Care Physician: Gilford Rile, MD  Note by: Kathlen Brunswick. Dossie Arbour, M.D, DABA, DABAPM, DABPM, DABIPP, FIPP  Referring Physician: [name], MD  Specialty: Board-Certified Interventional Pain Management     Primary Reason(s) for Visit: Encounter for prescription drug management (Level of risk: moderate) CC: Back Pain   HPI  Lydia Hernandez is a 60 y.o. year old, female patient, who returns today as an established patient. She has Chronic pain; Chronic pain syndrome; Chronic low back pain; Failed back surgical syndrome; Postlaminectomy syndrome, lumbar region; Chronic radicular lumbar pain (left leg); Long term current use of opiate analgesic; Long term prescription opiate use; Opiate use (213.75 MME/Day); Opiate dependence ([place]); Encounter for therapeutic drug level monitoring; Lumbar spondylosis; Epidural fibrosis; Lumbar facet syndrome, bilateral; Chronic sacroiliac joint pain (Bilateral); Chronic lower extremity pain (Bilateral); Essential hypertension; History of tobacco abuse; Generalized anxiety disorder; Depression; Hiatal hernia; GERD (gastroesophageal reflux disease); Irritable bowel syndrome; Chronic constipation; Opioid-induced constipation (OIC); Hypothyroidism; Fibromyalgia; Neurogenic pain; Vitamin D insufficiency; Osteoporosis; Vitamin B12 deficiency; Chronic neck pain; Cervical spondylosis; Lumbar facet hypertrophy; 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 Her primarily concern today is the Back Pain  Pain Assessment: Self-Reported Pain Score: 5  Reported level is compatible with observation Pain Type: Chronic pain Pain Location: Back Pain Orientation:  Lower Pain Descriptors / Indicators: Aching, Constant, Sharp, Radiating Pain Frequency: Constant  The patient comes in today clinics today for pharmacological management of her chronic pain. Today we took the time to talk to her about the CDC guidelines and in view of the fact that her daily MME is above 50, we will be prescribing Narcan for the patient to have available. The patient has been fully oriented as to the use of this Narcan.  Date of Last Visit: 10/23/15 Service Provided on Last Visit: Procedure ([place])  Controlled Substance Pharmacotherapy Assessment  Analgesic: Duragesic 75 mcg/h every 72 hours + hydrocodone/APAP 7.5/325 one every 8 hours (22.5 mg/day) Pill Count: Hydrocodone pill count # 24/90 Filled 10-02-15. Fentanyl patch count #2/5 Filled 10-02-15. MME/day: 213.75 mg/day.  Pharmacokinetics: Onset of action (Liberation/Absorption): Within expected pharmacological parameters Time to Peak effect (Distribution): Timing and results are as within normal expected parameters Duration of action (Metabolism/Excretion): Within normal limits for medication Pharmacodynamics: Analgesic Effect: More than 50% Activity Facilitation: Medication(s) allow patient to sit, stand, walk, and do the basic ADLs Perceived Effectiveness: Described as relatively effective, allowing for increase in activities of daily living (ADL) Side-effects or Adverse reactions: None reported Monitoring: [place] PMP: Online review of the past 55-month period conducted. Compliant with practice rules and regulations UDS Results/interpretation: The patient's last UDS was done on 08/06/2015 and it came back within normal limits with no unexpected results. Medication Assessment Form: Reviewed. Patient indicates being compliant with therapy Treatment compliance: Compliant Risk Assessment: Aberrant Behavior: None observed today Substance Use Disorder (SUD) Risk Level: Low Risk of opioid abuse or dependence: 0.7-3.0% with  doses ? 36 MME/day and 6.1-26% with doses ? 120 MME/day. Opioid Risk Tool (ORT) Score:  3 Low Risk for SUD (Score <3) Depression Scale Score: PHQ-2: PHQ-2 Total Score: 0 No depression (0) PHQ-9: PHQ-9 Total Score: 0 No depression (0-4)  Pharmacologic Plan: No change in therapy, at  this time  Laboratory Chemistry  Inflammation Markers No results found for: ESRSEDRATE, CRP  Renal Function No results found for: BUN, CREATININE, GFRAA, GFRNONAA  Hepatic Function No results found for: AST, ALT, ALBUMIN  Electrolytes No results found for: NA, K, CL, CALCIUM, MG  Pain Modulating Vitamins No results found for: VD25OH, [id number], [id number], UK:[id number], VITAMINB12  Coagulation Parameters No results found for: INR, LABPROT  Note: I personally reviewed the above data. Results shared with patient.  Meds  The patient has a current medication list which includes the following prescription(s): cetirizine, cyclobenzaprine, estrogen (conjugated)-medroxyprogesterone, fentanyl, fluconazole, furosemide, gabapentin, hydrochlorothiazide, hydrocodone-acetaminophen, hydrocodone-acetaminophen, hydrocodone-acetaminophen, medroxyprogesterone, meloxicam, metoprolol succinate, pantoprazole, potassium chloride, travoprost (benzalkonium), fentanyl, fentanyl, lubiprostone, naloxone hcl, and benefiber.  Current Outpatient Prescriptions on File Prior to Visit  Medication Sig  . cetirizine (ZYRTEC) 10 MG tablet Take 10 mg by mouth daily.  Marland Kitchen estrogen, conjugated,-medroxyprogesterone (PREMPRO) 0.3-1.5 MG tablet Take 1 tablet by mouth daily.  . fluconazole (DIFLUCAN) 150 MG tablet as needed.   . furosemide (LASIX) 20 MG tablet Take 20 mg by mouth as needed.   . hydrochlorothiazide (MICROZIDE) 12.5 MG capsule Take 12.5 mg by mouth daily. Reported on 08/06/2015  . medroxyPROGESTERone (PROVERA) 5 MG tablet Take 5 mg by mouth daily.   . metoprolol succinate (TOPROL-XL) 25 MG 24 hr tablet Take 25 mg by mouth daily.   .  pantoprazole (PROTONIX) 20 MG tablet Take 20 mg by mouth daily.  . potassium chloride (KLOR-CON) 8 MEQ tablet Take 8 mEq by mouth daily as needed. For cramps.  . travoprost, benzalkonium, (TRAVATAN) 0.004 % ophthalmic solution Place 1 drop into both eyes at bedtime.    No current facility-administered medications on file prior to visit.    ROS  Constitutional: Afebrile, no chills, well hydrated and well nourished Gastrointestinal: No upper or lower GI bleeding, no nausea, no vomiting and no acute GI distress Musculoskeletal: No acute joint swelling or redness, no acute loss of range of motion and no acute onset weakness Neurological: Denies any acute onset apraxia, no episodes of paralysis, no acute loss of coordination, no acute loss of consciousness and no acute onset aphasia, dysarthria, agnosia, or amnesia  Allergies  Ms. Jenniges has No Known Allergies.  [place]  Medical:  Ms. Wride  has a past medical history of Hypertension; Chronic back pain; Anxiety; Depression; Hiatal hernia; Hypothyroidism; Fibromyalgia; GERD (gastroesophageal reflux disease); IBS (irritable bowel syndrome); Osteoarthritis; History of tobacco abuse (05/23/2015); Musculoskeletal pain (05/23/2015); and Neuropathic pain (05/23/2015). Family: family history includes Cancer in her father and mother. Surgical:  has past surgical history that includes Back surgery; Abdominal hysterectomy; Neck surgery; Ankle surgery (Right); Hernia repair; and Cholecystectomy. Tobacco:  reports that she has been smoking.  She does not have any smokeless tobacco history on file. Alcohol:  reports that she does not drink alcohol. Drug:  reports that she does not use illicit drugs.  Physical Examination  Constitutional Vitals:  Today's Vitals   11/01/15 1336 11/01/15 1341  BP: 136/79   Pulse: 72   Temp: 98 F (36.7 C)   TempSrc: Oral   Resp: 16   Height: 5\' 5"  (1.651 m)   Weight: 203 lb (92.08 kg)   SpO2: 98%   PainSc: 5  5   PainLoc:  Back    Calculated BMI: Body mass index is 33.78 kg/(m^2). Obese (Class I) (30-34.9 kg/m2) - 68% higher incidence of chronic pain General appearance: alert, cooperative, oriented, in no distress, overweight, well nourished and well hydrated Eyes:  PERLA Respiratory: No evidence respiratory distress, no audible rales or ronchi and no use of accessory muscles of respiration Psych: Alert, oriented to person, oriented to place and oriented to time  Cervical Spine Exam  Inspection: Normal anatomy, no anomalies observed Cervical Lordosis: Normal Alignment: Symetrical Functional ROM: Within functional limits [facility]) AROM: WFL Sensory: No sensory anomalies reported or detected  Upper Extremity Exam    Right  Left  Inspection: No gross anomalies detected  Inspection: No gross anomalies detected  Functional ROM: Adequate  Functional ROM: Adequate  AROM: Adequate  AROM: Adequate  Sensory: No sensory anomalies reported or detected  Sensory: No sensory anomalies reported or detected  Motor: Unremarkable  Motor: Unremarkable  Vascular: Normal skin color, temperature, and hair growth. No peripheral edema or cyanosis  Vascular: Normal skin color, temperature, and hair growth. No peripheral edema or cyanosis   Thoracic Spine  Inspection: No gross anomalies detected Alignment: Symetrical Functional ROM: Within functional limits [facility]) AROM: Adequate Palpation: WNL  Lumbar Spine  Inspection: There is clear evidence of a prior lumbar spine surgery with a well-healed scar. Alignment: Symetrical Functional ROM: Within functional limits (WFL) AROM: Decreased Sensory: No sensory anomalies reported or detected Palpation: Tender Provocative Tests: Lumbar Hyperextension and rotation test: deferred Patrick's Maneuver: deferred  Gait Assessment  Gait: Antalgic (limping)  Lower Extremities    Right  Left  Inspection: No gross anomalies detected  Inspection: No gross anomalies detected  Functional ROM:  Within functional limits [facility])  Functional ROM: Within functional limits (WFL)  AROM: Adequate  AROM: Adequate  Sensory: No sensory anomalies reported or detected  Sensory: No sensory anomalies reported or detected  Motor: Unremarkable  Motor: Unremarkable  Toe walk (S1): WNL  Toe walk (S1): WNL  Heal walk (L5): WNL  Heal walk (L5): WNL   Assessment & Plan  Primary Diagnosis & Pertinent Problem List: The primary encounter diagnosis was Chronic pain. Diagnoses of Encounter for therapeutic drug level monitoring, Long term current use of opiate analgesic, Opioid-induced constipation (OIC), Fibromyalgia, Chronic radicular lumbar pain (left leg), Opiate use (213.75 MME/Day), Muscle spasm, Neurogenic pain, and Osteoarthritis of spine with radiculopathy, lumbar region were also pertinent to this visit.  Visit Diagnosis: 1. Chronic pain   2. Encounter for therapeutic drug level monitoring   3. Long term current use of opiate analgesic   4. Opioid-induced constipation (OIC)   5. Fibromyalgia   6. Chronic radicular lumbar pain (left leg)   7. Opiate use (213.75 MME/Day)   8. Muscle spasm   9. Neurogenic pain   10. Osteoarthritis of spine with radiculopathy, lumbar region     Problems updated and reviewed during this visit: Problem  Muscle Spasm  Opioid-induced constipation (OIC)  Opiate use (213.75 MME/Day)  History of Tobacco Abuse    Problem-specific Plan(s): No problem-specific assessment & plan notes found for this encounter.  No new assessment & plan notes have been filed under this hospital service since the last note was generated. Service: Pain Management   Plan of Care   Problem List Items Addressed This Visit      High   Chronic pain - Primary (Chronic)   Relevant Medications   fentaNYL (DURAGESIC - DOSED MCG/HR) 75 MCG/HR   fentaNYL (DURAGESIC - DOSED MCG/HR) 75 MCG/HR   fentaNYL (DURAGESIC - DOSED MCG/HR) 75 MCG/HR   HYDROcodone-acetaminophen (NORCO) 7.5-325 MG  tablet   HYDROcodone-acetaminophen (NORCO) 7.5-325 MG tablet   HYDROcodone-acetaminophen (NORCO) 7.5-325 MG tablet   gabapentin (NEURONTIN) 300 MG capsule  meloxicam (MOBIC) 15 MG tablet   cyclobenzaprine (FLEXERIL) 10 MG tablet   Chronic radicular lumbar pain (left leg) (Chronic)   Relevant Orders   LUMBAR EPIDURAL STEROID INJECTION   Fibromyalgia (Chronic)   Relevant Medications   gabapentin (NEURONTIN) 300 MG capsule   meloxicam (MOBIC) 15 MG tablet   Lumbar spondylosis (Chronic)   Relevant Medications   fentaNYL (DURAGESIC - DOSED MCG/HR) 75 MCG/HR   fentaNYL (DURAGESIC - DOSED MCG/HR) 75 MCG/HR   fentaNYL (DURAGESIC - DOSED MCG/HR) 75 MCG/HR   HYDROcodone-acetaminophen (NORCO) 7.5-325 MG tablet   HYDROcodone-acetaminophen (NORCO) 7.5-325 MG tablet   HYDROcodone-acetaminophen (NORCO) 7.5-325 MG tablet   meloxicam (MOBIC) 15 MG tablet   cyclobenzaprine (FLEXERIL) 10 MG tablet   Muscle spasm (Chronic)   Relevant Medications   cyclobenzaprine (FLEXERIL) 10 MG tablet   Neurogenic pain (Chronic)     Medium   Encounter for therapeutic drug level monitoring   Long term current use of opiate analgesic (Chronic)   Opiate use (213.75 MME/Day) (Chronic)   Relevant Medications   Naloxone HCl (NARCAN) 4 MG/0.1ML LIQD   Opioid-induced constipation (OIC) (Chronic)   Relevant Medications   lubiprostone (AMITIZA) 24 MCG capsule   Wheat Dextrin (BENEFIBER) POWD       Pharmacotherapy (Medications Ordered): Meds ordered this encounter  Medications  . fentaNYL (DURAGESIC - DOSED MCG/HR) 75 MCG/HR    Sig: Place 1 patch (75 mcg total) onto the skin every 3 (three) days.    Dispense:  10 patch    Refill:  0    Do not place this medication, or any other prescription from our practice, on "Automatic Refill". Patient may have prescription filled one day early if pharmacy is closed on scheduled refill date. Do not fill until: 12/02/15 To last until: 01/01/16  . fentaNYL (DURAGESIC - DOSED  MCG/HR) 75 MCG/HR    Sig: Place 1 patch (75 mcg total) onto the skin every 3 (three) days.    Dispense:  10 patch    Refill:  0    Do not place this medication, or any other prescription from our practice, on "Automatic Refill". Patient may have prescription filled one day early if pharmacy is closed on scheduled refill date. Do not fill until: 01/01/16 To last until: 01/31/16  . fentaNYL (DURAGESIC - DOSED MCG/HR) 75 MCG/HR    Sig: Place 1 patch (75 mcg total) onto the skin every 3 (three) days.    Dispense:  10 patch    Refill:  0    Do not place this medication, or any other prescription from our practice, on "Automatic Refill". Patient may have prescription filled one day early if pharmacy is closed on scheduled refill date. Do not fill until: 11/02/15 To last until: 12/02/15  . HYDROcodone-acetaminophen (NORCO) 7.5-325 MG tablet    Sig: Take 1 tablet by mouth every 8 (eight) hours as needed for moderate pain or severe pain.    Dispense:  90 tablet    Refill:  0    Do not place this medication, or any other prescription from our practice, on "Automatic Refill". Patient may have prescription filled one day early if pharmacy is closed on scheduled refill date. Do not fill until: 11/02/15 To last until: 12/02/15  . HYDROcodone-acetaminophen (NORCO) 7.5-325 MG tablet    Sig: Take 1 tablet by mouth every 8 (eight) hours as needed for moderate pain or severe pain.    Dispense:  90 tablet    Refill:  0  Do not place this medication, or any other prescription from our practice, on "Automatic Refill". Patient may have prescription filled one day early if pharmacy is closed on scheduled refill date. Do not fill until: 12/02/15 To last until: 01/01/16  . HYDROcodone-acetaminophen (NORCO) 7.5-325 MG tablet    Sig: Take 1 tablet by mouth every 8 (eight) hours as needed for moderate pain or severe pain.    Dispense:  90 tablet    Refill:  0    Do not place this medication, or any other  prescription from our practice, on "Automatic Refill". Patient may have prescription filled one day early if pharmacy is closed on scheduled refill date. Do not fill until: 01/01/16 To last until: 01/31/16  . gabapentin (NEURONTIN) 300 MG capsule    Sig: Take 1 capsule (300 mg total) by mouth 4 (four) times daily.    Dispense:  120 capsule    Refill:  2    Do not place this medication, or any other prescription from our practice, on "Automatic Refill". Patient may have prescription filled one day early if pharmacy is closed on scheduled refill date.  . meloxicam (MOBIC) 15 MG tablet    Sig: Take 1 tablet (15 mg total) by mouth daily.    Dispense:  30 tablet    Refill:  2    Do not place this medication, or any other prescription from our practice, on "Automatic Refill". Patient may have prescription filled one day early if pharmacy is closed on scheduled refill date.  . cyclobenzaprine (FLEXERIL) 10 MG tablet    Sig: Take 1 tablet (10 mg total) by mouth 3 (three) times daily as needed for muscle spasms.    Dispense:  90 tablet    Refill:  2    Do not add this medication to the electronic "Automatic Refill" notification system. Patient may have prescription filled one day early if pharmacy is closed on scheduled refill date.  . Naloxone HCl (NARCAN) 4 MG/0.1ML LIQD    Sig: Place 1 Bottle into the nose once. , then call 911, repeat if needed in other nostril with new bottle.    Dispense:  2 each    Refill:  0    Please provide the patient with clear instructions on the use of this device/medication.  Marland Kitchen lubiprostone (AMITIZA) 24 MCG capsule    Sig: Take 1 capsule (24 mcg total) by mouth 2 (two) times daily with a meal. Swallow the medication whole. Do not break or chew the medication.    Dispense:  60 capsule    Refill:  2    Do not place this medication, or any other prescription from our practice, on "Automatic Refill". Patient may have prescription filled one day early if pharmacy is  closed on scheduled refill date.  . Wheat Dextrin (BENEFIBER) POWD    Sig: Stir 2 tsp. TID into 4-8 oz of any non-carbonated beverage or soft food (hot or cold)    Dispense:  500 g    Refill:  PRN    Do not place this medication, or any other prescription from our practice, on "Automatic Refill". Patient may have prescription filled one day early if pharmacy is closed on scheduled refill date.   Lab-work & Procedure Ordered: Orders Placed This Encounter  Procedures  . LUMBAR EPIDURAL STEROID INJECTION    Imaging Ordered: None  Interventional Therapies: Scheduled:  None at this time.    Considering:  None at this time.    PRN  Procedures:  Palliative left L2-3 lumbar epidural steroid injection under fluoroscopic guidance and IV sedation.    Referral(s) or Consult(s): None at this time.  New Prescriptions   FENTANYL (DURAGESIC - DOSED MCG/HR) 75 MCG/HR    Place 1 patch (75 mcg total) onto the skin every 3 (three) days.   FENTANYL (DURAGESIC - DOSED MCG/HR) 75 MCG/HR    Place 1 patch (75 mcg total) onto the skin every 3 (three) days.   LUBIPROSTONE (AMITIZA) 24 MCG CAPSULE    Take 1 capsule (24 mcg total) by mouth 2 (two) times daily with a meal. Swallow the medication whole. Do not break or chew the medication.   NALOXONE HCL (NARCAN) 4 MG/0.1ML LIQD    Place 1 Bottle into the nose once. , then call 911, repeat if needed in other nostril with new bottle.   WHEAT DEXTRIN (BENEFIBER) POWD    Stir 2 tsp. TID into 4-8 oz of any non-carbonated beverage or soft food (hot or cold)    Medications administered during this visit: Ms. Herberg had no medications administered during this visit.  Future Appointments Date Time Provider [place]  01/30/2016 9:40 AM Milinda Pointer, MD [facility] [place] None    Primary Care Physician: Gilford Rile, MD Location: [facility] Note by: Kathlen Brunswick. Dossie Arbour, M.D, DABA, DABAPM, DABPM, DABIPP, FIPP  Pain Score  Disclaimer: We use the NRS-11 scale. This is a self-reported, subjective measurement of pain severity with only modest accuracy. It is used primarily to identify changes within a particular patient. It must be understood that outpatient pain scales are significantly less accurate that those used for research, where they can be applied under ideal controlled circumstances with minimal exposure to variables. In reality, the score is likely to be a combination of pain intensity and pain affect, where pain affect describes the degree of emotional arousal or changes in action readiness caused by the sensory experience of pain. Factors such as social and work situation, setting, emotional state, anxiety levels, expectation, and prior pain experience may influence pain perception and show large inter-individual differences that may also be affected by time variables.

## 2015-11-01 NOTE — Progress Notes (Signed)
Safety precautions to be maintained throughout the outpatient stay will include: orient to surroundings, keep bed in low position, maintain call bell within reach at all times, provide assistance with transfer out of bed and ambulation. Hydrocodone pill count # 24/90  Filled 10-02-15 Fentanyl patch count #2/5  Filled 10-02-15

## 2016-01-10 ENCOUNTER — Encounter (INDEPENDENT_AMBULATORY_CARE_PROVIDER_SITE_OTHER): Payer: Self-pay

## 2016-01-10 ENCOUNTER — Ambulatory Visit: Payer: Medicare Other | Attending: Pain Medicine | Admitting: Pain Medicine

## 2016-01-10 ENCOUNTER — Encounter: Payer: Self-pay | Admitting: Pain Medicine

## 2016-01-10 VITALS — BP 149/74 | HR 72 | Temp 97.9°F | Resp 16 | Ht 65.0 in | Wt 199.0 lb

## 2016-01-10 DIAGNOSIS — M62838 Other muscle spasm: Secondary | ICD-10-CM | POA: Diagnosis not present

## 2016-01-10 DIAGNOSIS — I10 Essential (primary) hypertension: Secondary | ICD-10-CM | POA: Insufficient documentation

## 2016-01-10 DIAGNOSIS — M542 Cervicalgia: Secondary | ICD-10-CM

## 2016-01-10 DIAGNOSIS — E538 Deficiency of other specified B group vitamins: Secondary | ICD-10-CM | POA: Diagnosis not present

## 2016-01-10 DIAGNOSIS — M4806 Spinal stenosis, lumbar region: Secondary | ICD-10-CM | POA: Diagnosis not present

## 2016-01-10 DIAGNOSIS — Z87891 Personal history of nicotine dependence: Secondary | ICD-10-CM | POA: Insufficient documentation

## 2016-01-10 DIAGNOSIS — M50222 Other cervical disc displacement at C5-C6 level: Secondary | ICD-10-CM | POA: Diagnosis not present

## 2016-01-10 DIAGNOSIS — M47896 Other spondylosis, lumbar region: Secondary | ICD-10-CM | POA: Diagnosis not present

## 2016-01-10 DIAGNOSIS — K589 Irritable bowel syndrome without diarrhea: Secondary | ICD-10-CM | POA: Insufficient documentation

## 2016-01-10 DIAGNOSIS — M47812 Spondylosis without myelopathy or radiculopathy, cervical region: Secondary | ICD-10-CM | POA: Diagnosis not present

## 2016-01-10 DIAGNOSIS — M4726 Other spondylosis with radiculopathy, lumbar region: Secondary | ICD-10-CM

## 2016-01-10 DIAGNOSIS — Z79891 Long term (current) use of opiate analgesic: Secondary | ICD-10-CM | POA: Insufficient documentation

## 2016-01-10 DIAGNOSIS — M545 Low back pain: Secondary | ICD-10-CM | POA: Diagnosis not present

## 2016-01-10 DIAGNOSIS — M25519 Pain in unspecified shoulder: Secondary | ICD-10-CM | POA: Diagnosis present

## 2016-01-10 DIAGNOSIS — M797 Fibromyalgia: Secondary | ICD-10-CM | POA: Diagnosis not present

## 2016-01-10 DIAGNOSIS — M5416 Radiculopathy, lumbar region: Secondary | ICD-10-CM | POA: Diagnosis not present

## 2016-01-10 DIAGNOSIS — M533 Sacrococcygeal disorders, not elsewhere classified: Secondary | ICD-10-CM | POA: Insufficient documentation

## 2016-01-10 DIAGNOSIS — E559 Vitamin D deficiency, unspecified: Secondary | ICD-10-CM | POA: Diagnosis not present

## 2016-01-10 DIAGNOSIS — K5903 Drug induced constipation: Secondary | ICD-10-CM | POA: Insufficient documentation

## 2016-01-10 DIAGNOSIS — D1779 Benign lipomatous neoplasm of other sites: Secondary | ICD-10-CM | POA: Diagnosis not present

## 2016-01-10 DIAGNOSIS — F1190 Opioid use, unspecified, uncomplicated: Secondary | ICD-10-CM | POA: Insufficient documentation

## 2016-01-10 DIAGNOSIS — K449 Diaphragmatic hernia without obstruction or gangrene: Secondary | ICD-10-CM | POA: Insufficient documentation

## 2016-01-10 DIAGNOSIS — G8929 Other chronic pain: Secondary | ICD-10-CM

## 2016-01-10 DIAGNOSIS — Z981 Arthrodesis status: Secondary | ICD-10-CM | POA: Diagnosis not present

## 2016-01-10 DIAGNOSIS — K219 Gastro-esophageal reflux disease without esophagitis: Secondary | ICD-10-CM | POA: Diagnosis not present

## 2016-01-10 DIAGNOSIS — M25511 Pain in right shoulder: Secondary | ICD-10-CM | POA: Diagnosis not present

## 2016-01-10 DIAGNOSIS — M4802 Spinal stenosis, cervical region: Secondary | ICD-10-CM | POA: Diagnosis not present

## 2016-01-10 DIAGNOSIS — M549 Dorsalgia, unspecified: Secondary | ICD-10-CM | POA: Diagnosis present

## 2016-01-10 DIAGNOSIS — G9619 Other disorders of meninges, not elsewhere classified: Secondary | ICD-10-CM | POA: Diagnosis not present

## 2016-01-10 DIAGNOSIS — F329 Major depressive disorder, single episode, unspecified: Secondary | ICD-10-CM | POA: Diagnosis not present

## 2016-01-10 DIAGNOSIS — M79606 Pain in leg, unspecified: Secondary | ICD-10-CM

## 2016-01-10 DIAGNOSIS — M79605 Pain in left leg: Secondary | ICD-10-CM | POA: Insufficient documentation

## 2016-01-10 DIAGNOSIS — F411 Generalized anxiety disorder: Secondary | ICD-10-CM | POA: Insufficient documentation

## 2016-01-10 DIAGNOSIS — E039 Hypothyroidism, unspecified: Secondary | ICD-10-CM | POA: Insufficient documentation

## 2016-01-10 DIAGNOSIS — M79604 Pain in right leg: Secondary | ICD-10-CM | POA: Diagnosis not present

## 2016-01-10 DIAGNOSIS — M539 Dorsopathy, unspecified: Secondary | ICD-10-CM | POA: Diagnosis not present

## 2016-01-10 DIAGNOSIS — M961 Postlaminectomy syndrome, not elsewhere classified: Secondary | ICD-10-CM

## 2016-01-10 HISTORY — DX: Other chronic pain: G89.29

## 2016-01-10 MED ORDER — FENTANYL CITRATE (PF) 100 MCG/2ML IJ SOLN
25.0000 ug | INTRAMUSCULAR | Status: DC | PRN
  Filled 2016-01-10: qty 2

## 2016-01-10 MED ORDER — FENTANYL 75 MCG/HR TD PT72: 75.0000 ug | MEDICATED_PATCH | TRANSDERMAL | Status: DC

## 2016-01-10 MED ORDER — SODIUM CHLORIDE 0.9 % IJ SOLN
INTRAMUSCULAR | Status: AC
  Filled 2016-01-10: qty 10

## 2016-01-10 MED ORDER — HYDROCODONE-ACETAMINOPHEN 7.5-325 MG PO TABS: 1.0000 | ORAL_TABLET | Freq: Three times a day (TID) | ORAL | Status: DC | PRN

## 2016-01-10 MED ORDER — ROPIVACAINE HCL 2 MG/ML IJ SOLN
2.0000 mL | Freq: Once | INTRAMUSCULAR | Status: AC
  Administered 2016-01-10: 2 mL via EPIDURAL

## 2016-01-10 MED ORDER — MIDAZOLAM HCL 5 MG/5ML IJ SOLN
1.0000 mg | INTRAMUSCULAR | Status: DC | PRN
  Filled 2016-01-10: qty 5

## 2016-01-10 MED ORDER — SODIUM CHLORIDE 0.9% FLUSH
2.0000 mL | Freq: Once | INTRAVENOUS | Status: AC
  Administered 2016-01-10: 2 mL

## 2016-01-10 MED ORDER — TRIAMCINOLONE ACETONIDE 40 MG/ML IJ SUSP
40.0000 mg | Freq: Once | INTRAMUSCULAR | Status: AC
  Administered 2016-01-10: 40 mg
  Filled 2016-01-10: qty 1

## 2016-01-10 MED ORDER — LIDOCAINE HCL (PF) 1 % IJ SOLN
10.0000 mL | Freq: Once | INTRAMUSCULAR | Status: AC
  Administered 2016-01-10: 10 mL
  Filled 2016-01-10: qty 5

## 2016-01-10 MED ORDER — IOPAMIDOL (ISOVUE-M 200) INJECTION 41%
10.0000 mL | Freq: Once | INTRAMUSCULAR | Status: AC
  Administered 2016-01-10: 10 mL via EPIDURAL
  Filled 2016-01-10: qty 10

## 2016-01-10 MED ORDER — LACTATED RINGERS IV SOLN: 1000.0000 mL | Freq: Once | INTRAVENOUS | Status: DC

## 2016-01-10 NOTE — Progress Notes (Signed)
Safety precautions to be maintained throughout the outpatient stay will include: orient to surroundings, keep bed in low position, maintain call bell within reach at all times, provide assistance with transfer out of bed and ambulation.  

## 2016-01-10 NOTE — Progress Notes (Signed)
1425 Versed 2 mg IV, Fentanyl 100 mcg IV 1431 Versed 1 mg IV 1434 Versed 2 mg IV
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
## 2016-01-10 NOTE — Patient Instructions (Addendum)
Pain Management Discharge Instructions  General Discharge Instructions :  If you need to reach your doctor call: Monday-Friday 8:00 am - 4:00 pm at [phone] or toll free [phone].  After clinic hours [phone] to have operator reach doctor.  Bring all of your medication bottles to all your appointments in the pain clinic.  To cancel or reschedule your appointment with Pain Management please remember to call 24 hours in advance to avoid a fee.  Refer to the educational materials which you have been given on: General Risks, I had my Procedure. Discharge Instructions, Post Sedation.  Post Procedure Instructions:  The drugs you were given will stay in your system until tomorrow, so for the next 24 hours you should not drive, make any legal decisions or drink any alcoholic beverages.  You may eat anything you prefer, but it is better to start with liquids then soups and crackers, and gradually work up to solid foods.  Please notify your doctor immediately if you have any unusual bleeding, trouble breathing or pain that is not related to your normal pain.  Depending on the type of procedure that was done, some parts of your body may feel week and/or numb.  This usually clears up by tonight or the next day.  Walk with the use of an assistive device or accompanied by an adult for the 24 hours.  You may use ice on the affected area for the first 24 hours.  Put ice in a Ziploc bag and cover with a towel and place against area 15 minutes on 15 minutes off.  You may switch to heat after 24 hours.  Prescription for Fentanyl Patch and Norco given to patient after procedure.

## 2016-01-11 ENCOUNTER — Telehealth: Payer: Self-pay

## 2016-01-11 NOTE — Telephone Encounter (Signed)
Post procedure phone call.   No answer.  

## 2016-01-30 ENCOUNTER — Encounter: Payer: Medicare Other | Admitting: Pain Medicine

## 2016-02-11 ENCOUNTER — Encounter: Payer: Medicare Other | Admitting: Pain Medicine

## 2016-03-12 ENCOUNTER — Telehealth: Payer: Self-pay

## 2016-03-12 NOTE — Telephone Encounter (Signed)
Left voicemail for patient to please call us with additional information re; pain and we will let her know what we need to get her scheduled for.

## 2016-03-12 NOTE — Telephone Encounter (Signed)
Pt is having back pain and some leg pain what procedure do I need to schedule her for exactly??

## 2016-03-13 ENCOUNTER — Telehealth: Payer: Self-pay | Admitting: *Deleted

## 2016-03-13 NOTE — Telephone Encounter (Signed)
Patient is having catches in her back, lower back mainly in the left.  Right side also catches and goes down right leg to the calf.  Patient states she would like to have epidural.  Active orders in place in computer.  Patient educated on pre-procedure preparation and verbalizes u/o information.  Transferred up front for scheduling.

## 2016-03-25 ENCOUNTER — Ambulatory Visit: Payer: Medicare Other | Attending: Pain Medicine | Admitting: Pain Medicine

## 2016-03-25 ENCOUNTER — Encounter: Payer: Self-pay | Admitting: Pain Medicine

## 2016-03-25 VITALS — BP 116/57 | HR 86 | Temp 98.2°F | Resp 16 | Ht 65.0 in | Wt 193.0 lb

## 2016-03-25 DIAGNOSIS — K219 Gastro-esophageal reflux disease without esophagitis: Secondary | ICD-10-CM | POA: Diagnosis not present

## 2016-03-25 DIAGNOSIS — M545 Low back pain: Secondary | ICD-10-CM | POA: Insufficient documentation

## 2016-03-25 DIAGNOSIS — M79605 Pain in left leg: Secondary | ICD-10-CM

## 2016-03-25 DIAGNOSIS — K5909 Other constipation: Secondary | ICD-10-CM | POA: Insufficient documentation

## 2016-03-25 DIAGNOSIS — M5416 Radiculopathy, lumbar region: Secondary | ICD-10-CM | POA: Diagnosis not present

## 2016-03-25 DIAGNOSIS — M62838 Other muscle spasm: Secondary | ICD-10-CM | POA: Diagnosis not present

## 2016-03-25 DIAGNOSIS — K449 Diaphragmatic hernia without obstruction or gangrene: Secondary | ICD-10-CM | POA: Insufficient documentation

## 2016-03-25 DIAGNOSIS — E538 Deficiency of other specified B group vitamins: Secondary | ICD-10-CM | POA: Insufficient documentation

## 2016-03-25 DIAGNOSIS — M4726 Other spondylosis with radiculopathy, lumbar region: Secondary | ICD-10-CM | POA: Insufficient documentation

## 2016-03-25 DIAGNOSIS — I10 Essential (primary) hypertension: Secondary | ICD-10-CM | POA: Insufficient documentation

## 2016-03-25 DIAGNOSIS — F411 Generalized anxiety disorder: Secondary | ICD-10-CM | POA: Diagnosis not present

## 2016-03-25 DIAGNOSIS — M542 Cervicalgia: Secondary | ICD-10-CM | POA: Diagnosis present

## 2016-03-25 DIAGNOSIS — M533 Sacrococcygeal disorders, not elsewhere classified: Secondary | ICD-10-CM | POA: Insufficient documentation

## 2016-03-25 DIAGNOSIS — M79602 Pain in left arm: Secondary | ICD-10-CM

## 2016-03-25 DIAGNOSIS — Z981 Arthrodesis status: Secondary | ICD-10-CM | POA: Diagnosis not present

## 2016-03-25 DIAGNOSIS — M961 Postlaminectomy syndrome, not elsewhere classified: Secondary | ICD-10-CM | POA: Diagnosis not present

## 2016-03-25 DIAGNOSIS — K589 Irritable bowel syndrome without diarrhea: Secondary | ICD-10-CM | POA: Diagnosis not present

## 2016-03-25 DIAGNOSIS — E039 Hypothyroidism, unspecified: Secondary | ICD-10-CM | POA: Insufficient documentation

## 2016-03-25 DIAGNOSIS — D1779 Benign lipomatous neoplasm of other sites: Secondary | ICD-10-CM | POA: Insufficient documentation

## 2016-03-25 DIAGNOSIS — Z87891 Personal history of nicotine dependence: Secondary | ICD-10-CM | POA: Insufficient documentation

## 2016-03-25 DIAGNOSIS — M797 Fibromyalgia: Secondary | ICD-10-CM | POA: Insufficient documentation

## 2016-03-25 DIAGNOSIS — M25511 Pain in right shoulder: Secondary | ICD-10-CM | POA: Insufficient documentation

## 2016-03-25 DIAGNOSIS — M539 Dorsopathy, unspecified: Secondary | ICD-10-CM | POA: Diagnosis not present

## 2016-03-25 DIAGNOSIS — Z79891 Long term (current) use of opiate analgesic: Secondary | ICD-10-CM | POA: Diagnosis not present

## 2016-03-25 DIAGNOSIS — G8929 Other chronic pain: Secondary | ICD-10-CM | POA: Insufficient documentation

## 2016-03-25 DIAGNOSIS — G9619 Other disorders of meninges, not elsewhere classified: Secondary | ICD-10-CM | POA: Insufficient documentation

## 2016-03-25 DIAGNOSIS — M4802 Spinal stenosis, cervical region: Secondary | ICD-10-CM | POA: Insufficient documentation

## 2016-03-25 DIAGNOSIS — F329 Major depressive disorder, single episode, unspecified: Secondary | ICD-10-CM | POA: Insufficient documentation

## 2016-03-25 DIAGNOSIS — E559 Vitamin D deficiency, unspecified: Secondary | ICD-10-CM | POA: Insufficient documentation

## 2016-03-25 DIAGNOSIS — M47812 Spondylosis without myelopathy or radiculopathy, cervical region: Secondary | ICD-10-CM | POA: Diagnosis not present

## 2016-03-25 MED ORDER — MIDAZOLAM HCL 5 MG/5ML IJ SOLN
1.0000 mg | INTRAMUSCULAR | Status: DC | PRN
  Filled 2016-03-25: qty 5

## 2016-03-25 MED ORDER — FENTANYL CITRATE (PF) 100 MCG/2ML IJ SOLN
25.0000 ug | INTRAMUSCULAR | Status: DC | PRN
Start: 2016-03-25 — End: 2016-04-08
  Filled 2016-03-25: qty 2

## 2016-03-25 MED ORDER — LIDOCAINE HCL (PF) 1 % IJ SOLN
10.0000 mL | Freq: Once | INTRAMUSCULAR | Status: AC
  Administered 2016-03-25: 10 mL
  Filled 2016-03-25: qty 10

## 2016-03-25 MED ORDER — IOPAMIDOL (ISOVUE-M 200) INJECTION 41%
10.0000 mL | Freq: Once | INTRAMUSCULAR | Status: AC
  Administered 2016-03-25: 10 mL via EPIDURAL
  Filled 2016-03-25: qty 10

## 2016-03-25 MED ORDER — LACTATED RINGERS IV SOLN
1000.0000 mL | Freq: Once | INTRAVENOUS | Status: AC
  Administered 2016-03-25: 1000 mL via INTRAVENOUS

## 2016-03-25 MED ORDER — ROPIVACAINE HCL 2 MG/ML IJ SOLN
2.0000 mL | Freq: Once | INTRAMUSCULAR | Status: AC
  Administered 2016-03-25: 2 mL via EPIDURAL
  Filled 2016-03-25: qty 10

## 2016-03-25 MED ORDER — SODIUM CHLORIDE 0.9% FLUSH
2.0000 mL | Freq: Once | INTRAVENOUS | Status: AC
  Administered 2016-03-25: 2 mL

## 2016-03-25 MED ORDER — TRIAMCINOLONE ACETONIDE 40 MG/ML IJ SUSP
40.0000 mg | Freq: Once | INTRAMUSCULAR | Status: AC
  Administered 2016-03-25: 40 mg
  Filled 2016-03-25: qty 1

## 2016-03-25 MED ORDER — SODIUM CHLORIDE 0.9 % IJ SOLN
INTRAMUSCULAR | Status: AC
  Administered 2016-03-25: 10:00:00
  Filled 2016-03-25: qty 10

## 2016-03-25 NOTE — Progress Notes (Signed)
Patient's Name: Lydia Hernandez  MRN: [id number]  Referring Provider: [name], MD  DOB: October 17, 1955  PCP: Gilford Rile, MD  DOS: 03/25/2016  Note by: Kathlen Brunswick. Dossie Arbour, MD  Service setting: Ambulatory outpatient  Location: [facility] (AMB) Pain Management Facility  Visit type: Procedure  Specialty: Interventional Pain Management  Patient type: Established   Primary Reason(s) for Visit: Interventional Pain Management Treatment. CC: Back Pain (low); Neck Pain; and Shoulder Pain (right)   Procedure:  Anesthesia, Analgesia, Anxiolysis:  Type: Palliative Inter-Laminar Epidural Steroid Injection Region: Lumbar Level: L2-3 Level. Laterality: Left-Sided Paramedial  Type: Moderate (Conscious) Sedation & Local Anesthesia Local Anesthetic: Lidocaine 1% Route: Intravenous (IV) IV Access: Secured Sedation: Meaningful verbal contact was maintained at all times during the procedure  Indication(s): Analgesia & Anxiolysis   Indications: 1. Chronic radicular lumbar pain (left leg)   2. Chronic pain of lower extremity, left   3. Failed back surgical syndrome   4. Postlaminectomy syndrome, lumbar region     Pre-procedure Pain Score: 4/10 Post-procedure Pain Score: 0-No pain/10  Pre-Procedure Assessment:  Lydia Hernandez is a 60 y.o. year old, female patient, seen today for interventional treatment. She has Chronic pain; Chronic pain syndrome; Chronic low back pain; Failed back surgical syndrome; Postlaminectomy syndrome, lumbar region; Chronic radicular lumbar pain (left leg); Long term current use of opiate analgesic; Long term prescription opiate use; Opiate use (213.75 MME/Day); Opiate dependence ([place]); Encounter for therapeutic drug level monitoring; Lumbar spondylosis; Epidural fibrosis; Lumbar facet syndrome, bilateral; Chronic sacroiliac joint pain (Bilateral); Chronic lower extremity pain (Bilateral); Essential hypertension; History of tobacco abuse; Generalized anxiety disorder; Depression; Hiatal hernia;  GERD (gastroesophageal reflux disease); Irritable bowel syndrome; Chronic constipation; Opioid-induced constipation (OIC); Hypothyroidism; Fibromyalgia; Neurogenic pain; Vitamin D insufficiency; Osteoporosis; Vitamin B12 deficiency; Chronic neck pain; Cervical spondylosis; Lumbar facet hypertrophy; 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 Her primarily concern today is the Back Pain (low); Neck Pain; and Shoulder Pain (right)   Pain Type: Chronic pain Pain Location: Back (neck, shoulder) Pain Orientation: Lower Pain Descriptors / Indicators: Radiating, Aching, Sharp (catching) Pain Frequency: Constant  Date of Last Visit: 01/10/16 Service Provided on Last Visit: Procedure (LESI)  Coagulation Parameters No results found for: INR, LABPROT, APTT, PLT  Verification of the correct person, correct site (including marking of site), and correct procedure were performed and confirmed by the patient.  Consent: Secured. Under the influence of no sedatives a written informed consent was obtained, after having provided information on the risks and possible complications. To fulfill our ethical and legal obligations, as recommended by the American Medical Association's Code of Ethics, we have provided information to the patient about our clinical impression; the nature and purpose of the treatment or procedure; the risks, benefits, and possible complications of the intervention; alternatives; the risk(s) and benefit(s) of the alternative treatment(s) or procedure(s); and the risk(s) and benefit(s) of doing nothing. The patient was provided information about the risks and possible complications associated with the procedure. These include, but are not limited to, failure to achieve desired goals, infection, bleeding, organ or nerve damage, allergic  reactions, paralysis, and death. In the case of spinal procedures these may include, but are not limited to, failure to achieve desired goals, infection, bleeding, organ or nerve damage, allergic reactions, paralysis, and death. In addition, the patient was informed that Medicine is not an Chief Strategy Officer; therefore,  there is also the possibility of unforeseen risks and possible complications that may result in a catastrophic outcome. The patient indicated having understood very clearly. We have given the patient no guarantees and we have made no promises. Enough time was given to the patient to ask questions, all of which were answered to the patient's satisfaction.  Consent Attestation: I, the ordering provider, attest that I have discussed with the patient the benefits, risks, side-effects, alternatives, likelihood of achieving goals, and potential problems during recovery for the procedure that I have provided informed consent.  Pre-Procedure Preparation: Safety Precautions: Allergies reviewed. Appropriate site, procedure, and patient were confirmed by following the Joint Commission's Universal Protocol (UP.01.01.01), in the form of a "Time Out". The patient was asked to confirm marked site and procedure, before commencing. The patient was asked about blood thinners, or active infections, both of which were denied. Patient was assessed for positional comfort and all pressure points were checked before starting procedure. Infection Control Precautions: Sterile technique used. Standard Universal Precautions were taken as recommended by the Department of [facility] for Disease Control and Prevention (CDC). Standard pre-surgical skin prep was conducted. Respiratory hygiene and cough etiquette was practiced. Hand hygiene observed. Safe injection practices and needle disposal techniques followed. SDV (single dose vial) medications used. Medications properly checked for expiration dates and  contaminants. Personal protective equipment (PPE) used: Surgical mask. Sterile Radiation-resistant gloves. Monitoring:  As per clinic protocol. Vitals:   03/25/16 1022 03/25/16 1031 03/25/16 1041 03/25/16 1051  BP: 96/66 117/74 117/74 (!) 116/57  Pulse: 86     Resp: '16 16 16 16  ' Temp:      SpO2: 100% 97% 96% 98%  Weight:      Height:      Calculated BMI: Body mass index is 32.12 kg/m. Allergies: She has No Known Allergies.. Allergy Precautions: None required  Description of Procedure Process:  Time-out: "Time-out" completed before starting procedure, as per protocol. Position: Prone Target Area: For Epidural Steroid injections, the target area is the  interlaminar space, initially targeting the lower border of the superior vertebral body lamina. Approach: Posterior approach. Area Prepped: Entire Posterior Lumbosacral Region Prepping solution: ChloraPrep (2% chlorhexidine gluconate and 70% isopropyl alcohol) Safety Precautions: Aspiration looking for blood return was conducted prior to all injections. At no point did we inject any substances, as a needle was being advanced. No attempts were made at seeking any paresthesias. Safe injection practices and needle disposal techniques used. Medications properly checked for expiration dates. SDV (single dose vial) medications used. Description of the Procedure: Protocol guidelines were followed. The patient was placed in position over the fluoroscopy table. The target area was identified and the area prepped in the usual manner. Skin desensitized using vapocoolant spray. Skin & deeper tissues infiltrated with local anesthetic. Appropriate amount of time allowed to pass for local anesthetics to take effect. The procedure needle was introduced through the skin, ipsilateral to the reported pain, and advanced to the target area. Bone was contacted and the needle walked caudad, until the lamina was cleared. The epidural space was identified using  "loss-of-resistance technique" with 2-3 ml of PF-NaCl (0.9% NSS), in a 5cc LOR glass syringe. Proper needle placement secured. Negative aspiration confirmed. Solution injected in intermittent fashion, asking for systemic symptoms every 0.5cc of injectate. The needles were then removed and the area cleansed, making sure to leave some of the prepping solution back to take advantage of its long term bactericidal properties. EBL: None Materials & Medications  Used:  Needle(s) Used: 20g - 10cm, Tuohy-style epidural needle Medication(s): Please see chart orders for medication and dosing details.  Imaging Guidance:  Type of Imaging Technique: Fluoroscopy Guidance (Spinal) Indication(s): Assistance in needle guidance and placement for procedures requiring needle placement in or near specific anatomical locations not easily accessible without such assistance. Exposure Time: Please see nurses notes. Contrast: Before injecting any contrast, we confirmed that the patient did not have an allergy to iodine, shellfish, or radiological contrast. Once satisfactory needle placement was completed at the desired level, radiological contrast was injected. Injection was conducted under continuous fluoroscopic guidance. Injection of contrast accomplished without complications. See chart for type and volume of contrast used. Fluoroscopic Guidance: I was personally present in the fluoroscopy suite, where the patient was placed in position for the procedure, over the fluoroscopy-compatible table. Fluoroscopy was manipulated, using "Tunnel Vision Technique", to obtain the best possible view of the target area, on the affected side. Parallax error was corrected before commencing the procedure. A "direction-depth-direction" technique was used to introduce the needle under continuous pulsed fluoroscopic guidance. Once the target was reached, antero-posterior, oblique, and lateral fluoroscopic projection views were taken to confirm  needle placement in all planes. Permanently recorded images stored by scanning into EMR. Interpretation: Intraoperative imaging interpretation by performing Physician. Adequate needle placement confirmed in AP & Oblique Views. Appropriate spread of contrast to desired area. No evidence of afferent or efferent intravascular uptake. No intrathecal or subarachnoid spread observed. Permanent images scanned into the patient's record.  Antibiotic Prophylaxis:  Indication(s): No indications identified. Type:  Antibiotics Given (last 72 hours)    None       Post-operative Assessment:   Complications: No immediate post-treatment complications were observed. Relevant Post-operative Information:  Disposition: Return to clinic for follow-up evaluation. The patient tolerated the entire procedure well. A repeat set of vitals were taken after the procedure and the patient was kept under observation following institutional policy, for this procedure. Post-procedural neurological assessment was performed, showing return to baseline, prior to discharge. The patient was discharged home, once institutional criteria were met. The patient was provided with post-procedure discharge instructions, including a section on how to identify potential problems. Should any problems arise concerning this procedure, the patient was given instructions to immediately contact us, at any time, without hesitation. In any case, we plan to contact the patient by telephone for a follow-up status report regarding this interventional procedure. Comments:  No additional relevant information.  Plan of Care   Problem List Items Addressed This Visit      High   Chronic lower extremity pain (Bilateral) (Chronic)   Chronic radicular lumbar pain (left leg) - Primary (Chronic)   Relevant Medications   fentaNYL (SUBLIMAZE) injection 25-50 mcg   lactated ringers infusion 1,000 mL   midazolam (VERSED) 5 MG/5ML injection 1-2 mg   iopamidol  (ISOVUE-M) 41 % intrathecal injection 10 mL (Completed)   triamcinolone acetonide (KENALOG-40) injection 40 mg (Completed)   lidocaine (PF) (XYLOCAINE) 1 % injection 10 mL (Completed)   sodium chloride flush (NS) 0.9 % injection 2 mL (Completed)   ropivacaine (PF) 2 mg/ml (0.2%) (NAROPIN) epidural 2 mL (Completed)   Other Relevant Orders   Lumbar Epidural Injection   Failed back surgical syndrome (Chronic)   Relevant Medications   fentaNYL (DURAGESIC - DOSED MCG/HR) 50 MCG/HR   HYDROcodone-acetaminophen (NORCO) 7.5-325 MG tablet   fentaNYL (SUBLIMAZE) injection 25-50 mcg   triamcinolone acetonide (KENALOG-40) injection 40 mg (Completed)   Postlaminectomy syndrome, lumbar  region (Chronic)    Other Visit Diagnoses   None.     Requested PM Follow-up: Return in about 2 weeks (around 04/08/2016) for Post-Procedure evaluation.  Future Appointments Date Time Provider [place]  04/04/2016 11:00 AM [facility]  04/08/2016 10:00 AM Milinda Pointer, MD [facility] None    Primary Care Physician: Gilford Rile, MD Location: [facility] Note by: Kathlen Brunswick. Dossie Arbour, M.D, DABA, DABAPM, DABPM, DABIPP, FIPP  Disclaimer:  Medicine is not an exact science. The only guarantee in medicine is that nothing is guaranteed. It is important to note that the decision to proceed with this intervention was based on the information collected from the patient. The Data and conclusions were drawn from the patient's questionnaire, the interview, and the physical examination. Because the information was provided in large part by the patient, it cannot be guaranteed that it has not been purposely or unconsciously manipulated. Every effort has been made to obtain as much relevant data as possible for this evaluation. It is important to note that the conclusions that lead to this procedure are derived in large part from the available data. Always take into account that the  treatment will also be dependent on availability of resources and existing treatment guidelines, considered by other Pain Management Practitioners as being common knowledge and practice, at the time of the intervention. For Medico-Legal purposes, it is also important to point out that variation in procedural techniques and pharmacological choices are the acceptable norm. The indications, contraindications, technique, and results of the above procedure should only be interpreted and judged by a Board-Certified Interventional Pain Specialist with extensive familiarity and expertise in the same exact procedure and technique. Attempts at providing opinions without similar or greater experience and expertise than that of the treating physician will be considered as inappropriate and unethical, and shall result in a formal complaint to the state medical board and applicable specialty societies.

## 2016-03-25 NOTE — Patient Instructions (Addendum)
Pain Management Discharge Instructions  General Discharge Instructions :  If you need to reach your doctor call: Monday-Friday 8:00 am - 4:00 pm at [phone] or toll free [phone].  After clinic hours [phone] to have operator reach doctor.  Bring all of your medication bottles to all your appointments in the pain clinic.  To cancel or reschedule your appointment with Pain Management please remember to call 24 hours in advance to avoid a fee.  Refer to the educational materials which you have been given on: General Risks, I had my Procedure. Discharge Instructions, Post Sedation.  Post Procedure Instructions:  The drugs you were given will stay in your system until tomorrow, so for the next 24 hours you should not drive, make any legal decisions or drink any alcoholic beverages.  You may eat anything you prefer, but it is better to start with liquids then soups and crackers, and gradually work up to solid foods.  Please notify your doctor immediately if you have any unusual bleeding, trouble breathing or pain that is not related to your normal pain.  Depending on the type of procedure that was done, some parts of your body may feel week and/or numb.  This usually clears up by tonight or the next day.  Walk with the use of an assistive device or accompanied by an adult for the 24 hours.  You may use ice on the affected area for the first 24 hours.  Put ice in a Ziploc bag and cover with a towel and place against area 15 minutes on 15 minutes off.  You may switch to heat after 24 hours.Pain Management Discharge Instructions  General Discharge Instructions :  If you need to reach your doctor call: Monday-Friday 8:00 am - 4:00 pm at [phone] or toll free [phone].  After clinic hours [phone] to have operator reach doctor.  Bring all of your medication bottles to all your appointments in the pain clinic.  To cancel or reschedule your appointment with Pain  Management please remember to call 24 hours in advance to avoid a fee.  Refer to the educational materials which you have been given on: General Risks, I had my Procedure. Discharge Instructions, Post Sedation.  Post Procedure Instructions:  The drugs you were given will stay in your system until tomorrow, so for the next 24 hours you should not drive, make any legal decisions or drink any alcoholic beverages.  You may eat anything you prefer, but it is better to start with liquids then soups and crackers, and gradually work up to solid foods.  Please notify your doctor immediately if you have any unusual bleeding, trouble breathing or pain that is not related to your normal pain.  Depending on the type of procedure that was done, some parts of your body may feel week and/or numb.  This usually clears up by tonight or the next day.  Walk with the use of an assistive device or accompanied by an adult for the 24 hours.  You may use ice on the affected area for the first 24 hours.  Put ice in a Ziploc bag and cover with a towel and place against area 15 minutes on 15 minutes off.  You may switch to heat after 24 hours.Epidural Steroid Injection An epidural steroid injection is given to relieve pain in your neck, back, or legs that is caused by the irritation or swelling of a nerve root. This procedure involves injecting a steroid and numbing medicine (anesthetic) into the epidural  space. The epidural space is the space between the outer covering of your spinal cord and the bones that form your backbone (vertebra).  LET [facility] CARE PROVIDER KNOW ABOUT:   Any allergies you have.  All medicines you are taking, including vitamins, herbs, eye drops, creams, and over-the-counter medicines such as aspirin.  Previous problems you or members of your family have had with the use of anesthetics.  Any blood disorders or blood clotting disorders you have.  Previous surgeries you have had.  Medical  conditions you have. RISKS AND COMPLICATIONS Generally, this is a safe procedure. However, as with any procedure, complications can occur. Possible complications of epidural steroid injection include:  Headache.  Bleeding.  Infection.  Allergic reaction to the medicines.  Damage to your nerves. The response to this procedure depends on the underlying cause of the pain and its duration. People who have long-term (chronic) pain are less likely to benefit from epidural steroids than are those people whose pain comes on strong and suddenly. BEFORE THE PROCEDURE   Ask your health care provider about changing or stopping your regular medicines. You may be advised to stop taking blood-thinning medicines a few days before the procedure.  You may be given medicines to reduce anxiety.  Arrange for someone to take you home after the procedure. PROCEDURE   You will remain awake during the procedure. You may receive medicine to make you relaxed.  You will be asked to lie on your stomach.  The injection site will be cleaned.  The injection site will be numbed with a medicine (local anesthetic).  A needle will be injected through your skin into the epidural space.  Your health care provider will use an X-ray machine to ensure that the steroid is delivered closest to the affected nerve. You may have minimal discomfort at this time.  Once the needle is in the right position, the local anesthetic and the steroid will be injected into the epidural space.  The needle will then be removed and a bandage will be applied to the injection site. AFTER THE PROCEDURE   You may be monitored for a short time before you go home.  You may feel weakness or numbness in your arm or leg, which disappears within hours.  You may be allowed to eat, drink, and take your regular medicine.  You may have soreness at the site of the injection.   This information is not intended to replace advice given to you by  your health care provider. Make sure you discuss any questions you have with your health care provider.   Document Released: 10/14/2007 Document Revised: 03/09/2013 Document Reviewed: 12/24/2012 Elsevier Interactive Patient Education 2016 [employer] Facet Joint Block The facet joints connect the bones of the spine (vertebrae). They make it possible for you to bend, twist, and make other movements with your spine. They also prevent you from overbending, overtwisting, and making other excessive movements.  A facet joint block is a procedure where a numbing medicine (anesthetic) is injected into a facet joint. Often, a type of anti-inflammatory medicine called a steroid is also injected. A facet joint block may be done for two reasons:   Diagnosis. A facet joint block may be done as a test to see whether neck or back pain is caused by a worn-down or infected facet joint. If the pain gets better after a facet joint block, it means the pain is probably coming from the facet joint. If the pain does not  get better, it means the pain is probably not coming from the facet joint.   Therapy. A facet joint block may be done to relieve neck or back pain caused by a facet joint. A facet joint block is only done as a therapy if the pain does not improve with medicine, exercise programs, physical therapy, and other forms of pain management. LET [facility] CARE PROVIDER KNOW ABOUT:   Any allergies you have.   All medicines you are taking, including vitamins, herbs, eyedrops, and over-the-counter medicines and creams.   Previous problems you or members of your family have had with the use of anesthetics.   Any blood disorders you have had.   Other health problems you have. RISKS AND COMPLICATIONS Generally, having a facet joint block is safe. However, as with any procedure, complications can occur. Possible complications associated with having a facet joint block include:   Bleeding.   Injury to  a nerve near the injection site.   Pain at the injection site.   Weakness or numbness in areas controlled by nerves near the injection site.   Infection.   Temporary fluid retention.   Allergic reaction to anesthetics or medicines used during the procedure. BEFORE THE PROCEDURE   Follow your health care provider's instructions if you are taking dietary supplements or medicines. You may need to stop taking them or reduce your dosage.   Do not take any new dietary supplements or medicines without asking your health care provider first.   Follow your health care provider's instructions about eating and drinking before the procedure. You may need to stop eating and drinking several hours before the procedure.   Arrange to have an adult drive you home after the procedure. PROCEDURE  You may need to remove your clothing and dress in an open-back gown so that your health care provider can access your spine.   The procedure will be done while you are lying on an X-ray table. Most of the time you will be asked to lie on your stomach, but you may be asked to lie in a different position if an injection will be made in your neck.   Special machines will be used to monitor your oxygen levels, heart rate, and blood pressure.   If an injection will be made in your neck, an intravenous (IV) tube will be inserted into one of your veins. Fluids and medicine will flow directly into your body through the IV tube.   The area over the facet joint where the injection will be made will be cleaned with an antiseptic soap. The surrounding skin will be covered with sterile drapes.   An anesthetic will be applied to your skin to make the injection area numb. You may feel a temporary stinging or burning sensation.   A video X-ray machine will be used to locate the joint. A contrast dye may be injected into the facet joint area to help with locating the joint.   When the joint is located, an  anesthetic medicine will be injected into the joint through the needle.   Your health care provider will ask you whether you feel pain relief. If you do feel relief, a steroid may be injected to provide pain relief for a longer period of time. If you do not feel relief or feel only partial relief, additional injections of an anesthetic may be made in other facet joints.   The needle will be removed, the skin will be cleansed, and bandages will  be applied.  AFTER THE PROCEDURE   You will be observed for 15-30 minutes before being allowed to go home. Do not drive. Have an adult drive you or take a taxi or public transportation instead.   If you feel pain relief, the pain will return in several hours or days when the anesthetic wears off.   You may feel pain relief 2-14 days after the procedure. The amount of time this relief lasts varies from person to person.   It is normal to feel some tenderness over the injected area(s) for 2 days following the procedure.   If you have diabetes, you may have a temporary increase in blood sugar.   This information is not intended to replace advice given to you by your health care provider. Make sure you discuss any questions you have with your health care provider.   Document Released: 11/26/2006 Document Revised: 07/28/2014 Document Reviewed: 04/26/2012 Elsevier Interactive Patient Education 2016 [place] [employer] Refer to this sheet in the next few weeks. These instructions provide you with information on caring for yourself after your procedure. Your health care provider may also give you more specific instructions. Your treatment has been planned according to current medical practices, but problems sometimes occur. Call your health care provider if you have any problems or questions after your procedure. HOME CARE INSTRUCTIONS   Keep track of the amount of pain relief you feel and how long it lasts.  Limit pain  medicine within the first 4-6 hours after the procedure as directed by your health care provider.  Resume taking dietary supplements and medicines as directed by your health care provider.  You may resume your regular diet.  Do not apply heat near or over the injection site(s) for 24 hours.   Do not take a bath or soak in water (such as a pool or lake) for 24 hours.  Do not drive for 24 hours unless approved by your health care provider.  Avoid strenuous activity for 24 hours.  Remove your bandages the morning after the procedure.   If the injection site is tender, applying an ice pack may relieve some tenderness. To do this:  Put ice in a bag.  Place a towel between your skin and the bag.  Leave the ice on for 15-20 minutes, 3-4 times a day.  Keep follow-up appointments as directed by your health care provider. SEEK MEDICAL CARE IF:   Your pain is not controlled by your medicines.   There is drainage from the injection site.   There is significant bleeding or swelling at the injection site.  You have diabetes and your blood sugar is above 180 mg/dL. SEEK IMMEDIATE MEDICAL CARE IF:   You develop a fever of 101F (38.3C) or greater.   You have worsening pain or swelling around the injection site.   You have red streaking around the injection site.   You develop severe pain that is not controlled by your medicines.   You develop a headache, stiff neck, nausea, or vomiting.   Your eyes become very sensitive to light.   You have weakness, paralysis, or tingling in your arms or legs that was not present before the procedure.   You develop difficulty urinating or breathing.    This information is not intended to replace advice given to you by your health care provider. Make sure you discuss any questions you have with your health care provider.   Document Released: 06/23/2012 Document Revised:  07/28/2014 Document Reviewed: 06/23/2012 Elsevier Interactive  Patient Education [employer].

## 2016-03-26 ENCOUNTER — Telehealth: Payer: Self-pay | Admitting: *Deleted

## 2016-03-26 ENCOUNTER — Other Ambulatory Visit: Payer: Self-pay | Admitting: Pain Medicine

## 2016-03-26 DIAGNOSIS — M25511 Pain in right shoulder: Principal | ICD-10-CM

## 2016-03-26 DIAGNOSIS — G8929 Other chronic pain: Secondary | ICD-10-CM

## 2016-03-26 NOTE — Telephone Encounter (Signed)
No problems post procedure. 

## 2016-04-04 ENCOUNTER — Ambulatory Visit: Admission: RE | Admit: 2016-04-04 | Payer: Medicare Other | Source: Ambulatory Visit

## 2016-04-08 ENCOUNTER — Ambulatory Visit: Payer: Medicare Other | Attending: Pain Medicine | Admitting: Pain Medicine

## 2016-04-08 ENCOUNTER — Encounter: Payer: Self-pay | Admitting: Pain Medicine

## 2016-04-08 VITALS — BP 141/90 | HR 74 | Temp 98.3°F | Resp 16 | Ht 65.0 in | Wt 193.0 lb

## 2016-04-08 DIAGNOSIS — Z90710 Acquired absence of both cervix and uterus: Secondary | ICD-10-CM | POA: Insufficient documentation

## 2016-04-08 DIAGNOSIS — F1190 Opioid use, unspecified, uncomplicated: Secondary | ICD-10-CM

## 2016-04-08 DIAGNOSIS — M25511 Pain in right shoulder: Secondary | ICD-10-CM | POA: Diagnosis not present

## 2016-04-08 DIAGNOSIS — M4726 Other spondylosis with radiculopathy, lumbar region: Secondary | ICD-10-CM

## 2016-04-08 DIAGNOSIS — K5909 Other constipation: Secondary | ICD-10-CM | POA: Diagnosis not present

## 2016-04-08 DIAGNOSIS — Z87891 Personal history of nicotine dependence: Secondary | ICD-10-CM | POA: Insufficient documentation

## 2016-04-08 DIAGNOSIS — E559 Vitamin D deficiency, unspecified: Secondary | ICD-10-CM | POA: Insufficient documentation

## 2016-04-08 DIAGNOSIS — M79605 Pain in left leg: Secondary | ICD-10-CM | POA: Diagnosis not present

## 2016-04-08 DIAGNOSIS — F411 Generalized anxiety disorder: Secondary | ICD-10-CM | POA: Insufficient documentation

## 2016-04-08 DIAGNOSIS — Z79891 Long term (current) use of opiate analgesic: Secondary | ICD-10-CM | POA: Diagnosis not present

## 2016-04-08 DIAGNOSIS — F329 Major depressive disorder, single episode, unspecified: Secondary | ICD-10-CM | POA: Diagnosis not present

## 2016-04-08 DIAGNOSIS — E039 Hypothyroidism, unspecified: Secondary | ICD-10-CM | POA: Insufficient documentation

## 2016-04-08 DIAGNOSIS — M797 Fibromyalgia: Secondary | ICD-10-CM | POA: Insufficient documentation

## 2016-04-08 DIAGNOSIS — M545 Low back pain: Secondary | ICD-10-CM | POA: Insufficient documentation

## 2016-04-08 DIAGNOSIS — G8929 Other chronic pain: Secondary | ICD-10-CM

## 2016-04-08 DIAGNOSIS — Z9049 Acquired absence of other specified parts of digestive tract: Secondary | ICD-10-CM | POA: Diagnosis not present

## 2016-04-08 DIAGNOSIS — E538 Deficiency of other specified B group vitamins: Secondary | ICD-10-CM | POA: Insufficient documentation

## 2016-04-08 DIAGNOSIS — D1779 Benign lipomatous neoplasm of other sites: Secondary | ICD-10-CM | POA: Insufficient documentation

## 2016-04-08 DIAGNOSIS — K589 Irritable bowel syndrome without diarrhea: Secondary | ICD-10-CM | POA: Insufficient documentation

## 2016-04-08 DIAGNOSIS — M542 Cervicalgia: Secondary | ICD-10-CM | POA: Diagnosis not present

## 2016-04-08 DIAGNOSIS — Z5181 Encounter for therapeutic drug level monitoring: Secondary | ICD-10-CM | POA: Insufficient documentation

## 2016-04-08 DIAGNOSIS — I10 Essential (primary) hypertension: Secondary | ICD-10-CM | POA: Diagnosis not present

## 2016-04-08 DIAGNOSIS — Z809 Family history of malignant neoplasm, unspecified: Secondary | ICD-10-CM | POA: Insufficient documentation

## 2016-04-08 DIAGNOSIS — K219 Gastro-esophageal reflux disease without esophagitis: Secondary | ICD-10-CM | POA: Diagnosis not present

## 2016-04-08 DIAGNOSIS — M961 Postlaminectomy syndrome, not elsewhere classified: Secondary | ICD-10-CM | POA: Insufficient documentation

## 2016-04-08 DIAGNOSIS — M62838 Other muscle spasm: Secondary | ICD-10-CM | POA: Diagnosis not present

## 2016-04-08 MED ORDER — CYCLOBENZAPRINE HCL 10 MG PO TABS: 10.0000 mg | ORAL_TABLET | Freq: Three times a day (TID) | ORAL | 2 refills | Status: DC | PRN

## 2016-04-08 MED ORDER — HYDROCODONE-ACETAMINOPHEN 7.5-325 MG PO TABS: 1.0000 | ORAL_TABLET | Freq: Three times a day (TID) | ORAL | 0 refills | Status: DC | PRN

## 2016-04-08 MED ORDER — FENTANYL 75 MCG/HR TD PT72: 75.0000 ug | MEDICATED_PATCH | TRANSDERMAL | 0 refills | Status: DC

## 2016-04-08 MED ORDER — MELOXICAM 15 MG PO TABS: 15.0000 mg | ORAL_TABLET | Freq: Every day | ORAL | 2 refills | Status: AC

## 2016-04-08 MED ORDER — GABAPENTIN 300 MG PO CAPS: 300.0000 mg | ORAL_CAPSULE | Freq: Four times a day (QID) | ORAL | 2 refills | Status: DC

## 2016-04-08 NOTE — Progress Notes (Signed)
Patient's Name: Lydia Hernandez  MRN: [id number]  Referring Provider: [name], MD  DOB: Jan 28, 1956  PCP: Gilford Rile, MD  DOS: 04/08/2016  Note by: Kathlen Brunswick. Dossie Arbour, MD  Service setting: Ambulatory outpatient  Specialty: Interventional Pain Management  Location: [facility] (AMB) Pain Management Facility    Patient type: Established   Primary Reason(s) for Visit: Encounter for prescription drug management & post-procedure evaluation of chronic illness with mild to moderate exacerbation(Level of risk: moderate) CC: Leg Pain (left leg "middle part") and Back Pain (lower)  HPI  Lydia Hernandez is a 60 y.o. year old, female patient, who returns today as an established patient. She has Chronic pain; Chronic pain syndrome; Chronic low back pain (Location of Secondary source of pain) (Bilateral) (L>R); Failed back surgical syndrome; Lumbar Postlaminectomy Syndrome; Chronic lumbar radicular pain (Left); Long term current use of opiate analgesic; Long term prescription opiate use; Opiate use (213.75 MME/Day); Opiate dependence ([place]); Encounter for therapeutic drug level monitoring; Lumbar spondylosis; Epidural fibrosis; Lumbar facet syndrome (Bilateral) (L>R); Chronic sacroiliac joint pain (Bilateral); Chronic lower extremity pain (Bilateral) (L>R); Essential hypertension; History of tobacco abuse; Generalized anxiety disorder; Depression; Hiatal hernia; GERD (gastroesophageal reflux disease); Irritable bowel syndrome; Chronic constipation; Opioid-induced constipation (OIC); Hypothyroidism; Fibromyalgia; Neurogenic pain; Vitamin D insufficiency; Osteoporosis; Vitamin B12 deficiency; Chronic neck pain (Location of Tertiary source of pain) (Bilateral) (R>L); Cervical spondylosis; Lumbar facet hypertrophy; 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 Her primarily  concern today is the Leg Pain (left leg "middle part") and Back Pain (lower)  Pain Assessment: Self-Reported Pain Score: 2              Reported level is compatible with observation.       Pain Type: Chronic pain Pain Location: Leg Pain Orientation: Left Pain Descriptors / Indicators: Aching, Constant Pain Frequency: Intermittent  The patient comes into the clinics today for post-procedure evaluation on the interventional treatment done on 03/25/2016. In addition, she comes in today for pharmacological management of her chronic pain.  The patient  reports that she does not use drugs.  Date of Last Visit: 03/25/16 Service Provided on Last Visit: Procedure (LESI)  Controlled Substance Pharmacotherapy Assessment & REMS (Risk Evaluation and Mitigation Strategy)  Analgesic: Duragesic 75 mcg/h every 72 hours + hydrocodone/APAP 7.5/325 one every 8 hours (22.5 mg/day) MME/day: 213.75 mg/day.  Pill Count: Did not bring pills to be counted. Patient given a final warning. Pharmacokinetics: Onset of action (Liberation/Absorption): Within expected pharmacological parameters Time to Peak effect (Distribution): Timing and results are as within normal expected parameters Duration of action (Metabolism/Excretion): Within normal limits for medication Pharmacodynamics: Analgesic Effect: More than 50% Activity Facilitation: Medication(s) allow patient to sit, stand, walk, and do the basic ADLs Perceived Effectiveness: Described as relatively effective, allowing for increase in activities of daily living (ADL) Side-effects or Adverse reactions: None reported Monitoring: [place] PMP: Online review of the past 44-month period conducted. Compliant with practice rules and regulations Last UDS on record: ToxAssure Select 13  Date Value Ref Range Status  08/06/2015 FINAL  Final    Comment:    ==================================================================== TOXASSURE SELECT 2  (MW) ==================================================================== Test                             Result       Flag  Units Drug Present and Declared for Prescription Verification   Hydrocodone                    985          EXPECTED   ng/mg creat   Hydromorphone                  154          EXPECTED   ng/mg creat   Norhydrocodone                 1439         EXPECTED   ng/mg creat    Sources of hydrocodone include scheduled prescription    medications. Hydromorphone and norhydrocodone are expected    metabolites of hydrocodone. Hydromorphone is also available as a    scheduled prescription medication.   Fentanyl                       34           EXPECTED   ng/mg creat   Norfentanyl                    188          EXPECTED   ng/mg creat    Source of fentanyl is a scheduled prescription medication,    including IV, patch, and transmucosal formulations. Norfentanyl    is an expected metabolite of fentanyl. ==================================================================== Test                      Result    Flag   Units      Ref Range   Creatinine              67               mg/dL      >=20 ==================================================================== Declared Medications:  The flagging and interpretation on this report are based on the  following declared medications.  Unexpected results may arise from  inaccuracies in the declared medications.  **Note: The testing scope of this panel includes these medications:  Fentanyl  Hydrocodone  **Note: The testing scope of this panel does not include following  reported medications:  Gabapentin ==================================================================== For clinical consultation, please call [phone]. ====================================================================    UDS interpretation: Compliant          Medication Assessment Form: Reviewed. Patient indicates being compliant with  therapy Treatment compliance: Deficiencies noted and steps taken to remind the patient of the seriousness of adequate therapy compliance. Patient did not bring pills to be counted. Risk Assessment: Aberrant Behavior: None observed today Substance Use Disorder (SUD) Risk Level: Low-to-moderate Risk of opioid abuse or dependence: 0.7-3.0% with doses ? 36 MME/day and 6.1-26% with doses ? 120 MME/day. Opioid Risk Tool (ORT) Score:  1 Low Risk for SUD (Score <3) Depression Scale Score: PHQ-2: PHQ-2 Total Score: 0 No depression (0) PHQ-9: PHQ-9 Total Score: 0 No depression (0-4)  Pharmacologic Plan: No change in therapy, at this time  Post-Procedure Assessment  Procedure done on 03/25/2016: Palliative left L2-3 lumbar epidural steroid injection under fluoroscopic guidance and IV sedation. Complications experienced at the time of the procedure: None Side-effects or Adverse reactions: None reported Sedation: Sedation provided. When no sedatives are used, the analgesic levels obtained are directly associated with the effectiveness of the local anesthetics. On the other hand, when sedation is provided, the level  of analgesia obtained during the initial 1 hour immediately following the intervention, is believed to be the result of a combination of factors. These factors may include, but are not limited to: 1. The effectiveness of the local anesthetics used. 2. The effects of the analgesic(s) and/or anxiolytic(s) used. 3. The degree of discomfort experienced by the patient at the time of the procedure. 4. The patients ability and reliability in recalling and recording the events. 5. The presence and influence of possible secondary gains. Results: Relief during the 1st hour after the procedure: 100 % (Ultra-Short Term Relief) Interpretation note: Analgesia during this period is likely to be Local Anesthetic and/or IV Sedative (Analgesic/Anxiolitic) related Local Anesthesia: Long-acting (4-6 hours)  anesthetics used. The analgesic levels attained during this period are directly associated to the localized infiltration of local anesthetics and therefore cary significant diagnostic value as to the etiological location or origin of the pain. Results: Relief during the next 4 to 6 hour after the procedure: 100 % (Short Term Relief) Interpretation note: Complete relief confirms area to be the source of pain Long-Term Therapy: Steroids used. Results: Extended relief following procedure: 90 % (ongoing) Interpretation note: Long-term benefit would suggest an inflammatory etiology to the pain         Long-Term Benefits:  Current Relief (Now): 90%  Interpretation note: Persistent relief would suggest effective anti-inflammatory effects from steroids Interpretation of Results: The results of this palliative procedure continue to support the use of this type of therapy for her recurrent low back and lower extremity symptoms.  Laboratory Chemistry  Inflammation Markers No results found for: ESRSEDRATE, CRP Renal Function No results found for: BUN, CREATININE, GFRAA, GFRNONAA Hepatic Function No results found for: AST, ALT, ALBUMIN Electrolytes No results found for: NA, K, CL, CALCIUM, MG Pain Modulating Vitamins No results found for: [place], [id number], [id number], [id number], 25OHVITD1, 25OHVITD2, 25OHVITD3, VITAMINB12 Coagulation Parameters No results found for: INR, LABPROT, APTT, PLT Cardiovascular No results found for: BNP, HGB, HCT Note: Lab results reviewed.  Recent Diagnostic Imaging  No results found.  Meds  The patient has a current medication list which includes the following prescription(s): benzonatate, cetirizine, cyclobenzaprine, estrogen (conjugated)-medroxyprogesterone, fentanyl, fentanyl, fentanyl, furosemide, gabapentin, hydrochlorothiazide, hydrocodone-acetaminophen, hydrocodone-acetaminophen, hydrocodone-acetaminophen, medroxyprogesterone, meloxicam, metoprolol succinate,  pantoprazole, potassium chloride, and travoprost (benzalkonium).  Current Outpatient Prescriptions on File Prior to Visit  Medication Sig  . benzonatate (TESSALON) 100 MG capsule Take 100 mg by mouth.  . cetirizine (ZYRTEC) 10 MG tablet Take 10 mg by mouth daily.  Marland Kitchen estrogen, conjugated,-medroxyprogesterone (PREMPRO) 0.3-1.5 MG tablet Take 1 tablet by mouth daily.  . furosemide (LASIX) 20 MG tablet Take 20 mg by mouth as needed.   . hydrochlorothiazide (MICROZIDE) 12.5 MG capsule Take 12.5 mg by mouth daily. Reported on 08/06/2015  . medroxyPROGESTERone (PROVERA) 5 MG tablet Take 5 mg by mouth daily.   . metoprolol succinate (TOPROL-XL) 25 MG 24 hr tablet Take 25 mg by mouth daily.   . pantoprazole (PROTONIX) 20 MG tablet Take 20 mg by mouth daily.  . potassium chloride (KLOR-CON) 8 MEQ tablet Take 8 mEq by mouth daily as needed. For cramps.  . travoprost, benzalkonium, (TRAVATAN) 0.004 % ophthalmic solution Place 1 drop into both eyes at bedtime.    No current facility-administered medications on file prior to visit.     ROS  Constitutional: Denies any fever or chills Gastrointestinal: No reported hemesis, hematochezia, vomiting, or acute GI distress Musculoskeletal: Denies any acute onset joint swelling, redness, loss of ROM, or weakness Neurological:  No reported episodes of acute onset apraxia, aphasia, dysarthria, agnosia, amnesia, paralysis, loss of coordination, or loss of consciousness  Allergies  Lydia Hernandez has No Known Allergies.  [place]  Medical:  Lydia Hernandez  has a past medical history of Anxiety; Chronic back pain; Depression; Fibromyalgia; GERD (gastroesophageal reflux disease); Hiatal hernia; History of tobacco abuse (05/23/2015); Hypertension; Hypothyroidism; IBS (irritable bowel syndrome); Musculoskeletal pain (05/23/2015); Neuropathic pain (05/23/2015); and Osteoarthritis. Family: family history includes Cancer in her father and mother. Surgical:  has a past surgical history  that includes Back surgery; Abdominal hysterectomy; Neck surgery; Ankle surgery (Right); Hernia repair; and Cholecystectomy. Tobacco:  reports that she has been smoking.  She has never used smokeless tobacco. Alcohol:  reports that she does not drink alcohol. Drug:  reports that she does not use drugs.  Constitutional Exam  General appearance: Well nourished, well developed, and well hydrated. In no acute distress Vitals:   04/08/16 0948  BP: (!) 141/90  Pulse: 74  Resp: 16  Temp: 98.3 F (36.8 C)  TempSrc: Oral  SpO2: 100%  Weight: 193 lb (87.5 kg)  Height: 5\' 5"  (1.651 m)  BMI Assessment: Estimated body mass index is 32.12 kg/m as calculated from the following:   Height as of this encounter: 5\' 5"  (1.651 m).   Weight as of this encounter: 193 lb (87.5 kg).   BMI interpretation: (30-34.9 kg/m2) = Obese (Class I): This range is associated with a 68% higher incidence of chronic pain. BMI Readings from Last 4 Encounters:  04/08/16 32.12 kg/m  03/25/16 32.12 kg/m  01/10/16 33.12 kg/m  11/01/15 33.78 kg/m   Wt Readings from Last 4 Encounters:  04/08/16 193 lb (87.5 kg)  03/25/16 193 lb (87.5 kg)  01/10/16 199 lb (90.3 kg)  11/01/15 203 lb (92.1 kg)  Psych/Mental status: Alert and oriented x 3 (person, place, & time) Eyes: PERLA Respiratory: No evidence of acute respiratory distress  Cervical Spine Exam  Inspection: No masses, redness, or swelling Alignment: Symmetrical Functional ROM: ROM appears unrestricted Stability: No instability detected Muscle strength & Tone: Functionally intact Sensory: Unimpaired Palpation: Non-contributory  Upper Extremity (UE) Exam    Side: Right upper extremity  Side: Left upper extremity  Inspection: No masses, redness, swelling, or asymmetry  Inspection: No masses, redness, swelling, or asymmetry  Functional ROM: ROM appears unrestricted          Functional ROM: ROM appears unrestricted          Muscle strength & Tone: Functionally  intact  Muscle strength & Tone: Functionally intact  Sensory: Unimpaired  Sensory: Unimpaired  Palpation: Non-contributory  Palpation: Non-contributory   Thoracic Spine Exam  Inspection: No masses, redness, or swelling Alignment: Symmetrical Functional ROM: ROM appears unrestricted Stability: No instability detected Sensory: Unimpaired Muscle strength & Tone: Functionally intact Palpation: Non-contributory  Lumbar Spine Exam  Inspection: Well healed scar from previous spine surgery detected Alignment: Symmetrical Functional ROM: Fused Stability: No instability detected Muscle strength & Tone: Functionally intact Sensory: Movement-associated discomfort Palpation: Complains of area being tender to palpation Provocative Tests: Lumbar Hyperextension and rotation test: evaluation deferred today       Patrick's Maneuver: evaluation deferred today              Gait & Posture Assessment  Ambulation: Unassisted Gait: Antalgic Posture: WNL   Lower Extremity Exam    Side: Right lower extremity  Side: Left lower extremity  Inspection: No masses, redness, swelling, or asymmetry  Inspection: No masses, redness, swelling, or asymmetry  Functional ROM: ROM appears unrestricted          Functional ROM: ROM appears unrestricted          Muscle strength & Tone: Functionally intact  Muscle strength & Tone: Functionally intact  Sensory: Unimpaired  Sensory: Unimpaired  Palpation: Non-contributory  Palpation: Non-contributory    Assessment & Plan  Primary Diagnosis & Pertinent Problem List: The primary encounter diagnosis was Chronic pain. Diagnoses of Long term current use of opiate analgesic, Opiate use (213.75 MME/Day), Fibromyalgia, Muscle spasm, and Osteoarthritis of spine with radiculopathy, lumbar region were also pertinent to this visit.  Visit Diagnosis: 1. Chronic pain   2. Long term current use of opiate analgesic   3. Opiate use (213.75 MME/Day)   4. Fibromyalgia   5. Muscle  spasm   6. Osteoarthritis of spine with radiculopathy, lumbar region     Problems updated and reviewed during this visit: Problem  Lumbar facet syndrome (Bilateral) (L>R)  Chronic lower extremity pain (Bilateral) (L>R)  Chronic neck pain (Location of Tertiary source of pain) (Bilateral) (R>L)  Chronic low back pain (Location of Secondary source of pain) (Bilateral) (L>R)  Lumbar Postlaminectomy Syndrome  Chronic lumbar radicular pain (Left)    Problem-specific Plan(s): No problem-specific Assessment & Plan notes found for this encounter.  No new Assessment & Plan notes have been filed under this hospital service since the last note was generated. Service: Pain Management   Plan of Care   Problem List Items Addressed This Visit      High   Chronic pain - Primary (Chronic)   Relevant Medications   fentaNYL (DURAGESIC - DOSED MCG/HR) 75 MCG/HR   fentaNYL (DURAGESIC - DOSED MCG/HR) 75 MCG/HR (Start on 05/08/2016)   fentaNYL (DURAGESIC - DOSED MCG/HR) 75 MCG/HR (Start on 06/07/2016)   HYDROcodone-acetaminophen (NORCO) 7.5-325 MG tablet   HYDROcodone-acetaminophen (NORCO) 7.5-325 MG tablet (Start on 05/08/2016)   HYDROcodone-acetaminophen (NORCO) 7.5-325 MG tablet (Start on 06/07/2016)   gabapentin (NEURONTIN) 300 MG capsule   cyclobenzaprine (FLEXERIL) 10 MG tablet   meloxicam (MOBIC) 15 MG tablet   Fibromyalgia (Chronic)   Relevant Medications   gabapentin (NEURONTIN) 300 MG capsule   meloxicam (MOBIC) 15 MG tablet   Lumbar spondylosis (Chronic)   Relevant Medications   fentaNYL (DURAGESIC - DOSED MCG/HR) 75 MCG/HR   fentaNYL (DURAGESIC - DOSED MCG/HR) 75 MCG/HR (Start on 05/08/2016)   fentaNYL (DURAGESIC - DOSED MCG/HR) 75 MCG/HR (Start on 06/07/2016)   HYDROcodone-acetaminophen (NORCO) 7.5-325 MG tablet   HYDROcodone-acetaminophen (NORCO) 7.5-325 MG tablet (Start on 05/08/2016)   HYDROcodone-acetaminophen (NORCO) 7.5-325 MG tablet (Start on 06/07/2016)   cyclobenzaprine  (FLEXERIL) 10 MG tablet   meloxicam (MOBIC) 15 MG tablet   Muscle spasm (Chronic)   Relevant Medications   cyclobenzaprine (FLEXERIL) 10 MG tablet     Medium   Long term current use of opiate analgesic (Chronic)   Opiate use (213.75 MME/Day) (Chronic)    Other Visit Diagnoses   None.      Pharmacotherapy (Medications Ordered): Meds ordered this encounter  Medications  . fentaNYL (DURAGESIC - DOSED MCG/HR) 75 MCG/HR    Sig: Place 1 patch (75 mcg total) onto the skin every 3 (three) days.    Dispense:  10 patch    Refill:  0    Do not place this medication, or any other prescription from our practice, on "Automatic Refill". Patient may have prescription filled one day early if pharmacy is closed on scheduled refill date. Do not fill until: 04/08/16  To last until: 05/08/16  . fentaNYL (DURAGESIC - DOSED MCG/HR) 75 MCG/HR    Sig: Place 1 patch (75 mcg total) onto the skin every 3 (three) days.    Dispense:  10 patch    Refill:  0    Do not place this medication, or any other prescription from our practice, on "Automatic Refill". Patient may have prescription filled one day early if pharmacy is closed on scheduled refill date. Do not fill until: 05/08/16 To last until: 06/07/16  . fentaNYL (DURAGESIC - DOSED MCG/HR) 75 MCG/HR    Sig: Place 1 patch (75 mcg total) onto the skin every 3 (three) days.    Dispense:  10 patch    Refill:  0    Do not place this medication, or any other prescription from our practice, on "Automatic Refill". Patient may have prescription filled one day early if pharmacy is closed on scheduled refill date. Do not fill until: 06/07/16 To last until: 07/07/16  . HYDROcodone-acetaminophen (NORCO) 7.5-325 MG tablet    Sig: Take 1 tablet by mouth every 8 (eight) hours as needed for severe pain.    Dispense:  90 tablet    Refill:  0    Do not place this medication, or any other prescription from our practice, on "Automatic Refill". Patient may have prescription  filled one day early if pharmacy is closed on scheduled refill date. Do not fill until: 04/08/16 To last until: 05/08/16  . HYDROcodone-acetaminophen (NORCO) 7.5-325 MG tablet    Sig: Take 1 tablet by mouth every 8 (eight) hours as needed for severe pain.    Dispense:  90 tablet    Refill:  0    Do not place this medication, or any other prescription from our practice, on "Automatic Refill". Patient may have prescription filled one day early if pharmacy is closed on scheduled refill date. Do not fill until: 05/08/16 To last until: 06/07/16  . HYDROcodone-acetaminophen (NORCO) 7.5-325 MG tablet    Sig: Take 1 tablet by mouth every 8 (eight) hours as needed for severe pain.    Dispense:  90 tablet    Refill:  0    Do not place this medication, or any other prescription from our practice, on "Automatic Refill". Patient may have prescription filled one day early if pharmacy is closed on scheduled refill date. Do not fill until: 06/07/16 To last until: 07/07/16  . gabapentin (NEURONTIN) 300 MG capsule    Sig: Take 1 capsule (300 mg total) by mouth 4 (four) times daily.    Dispense:  120 capsule    Refill:  2    Do not place this medication, or any other prescription from our practice, on "Automatic Refill". Patient may have prescription filled one day early if pharmacy is closed on scheduled refill date.  . cyclobenzaprine (FLEXERIL) 10 MG tablet    Sig: Take 1 tablet (10 mg total) by mouth 3 (three) times daily as needed for muscle spasms.    Dispense:  90 tablet    Refill:  2    Do not add this medication to the electronic "Automatic Refill" notification system. Patient may have prescription filled one day early if pharmacy is closed on scheduled refill date.  . meloxicam (MOBIC) 15 MG tablet    Sig: Take 1 tablet (15 mg total) by mouth daily.    Dispense:  30 tablet    Refill:  2    Do not place this medication, or any other prescription from  our practice, on "Automatic Refill". Patient  may have prescription filled one day early if pharmacy is closed on scheduled refill date.    Lab-work & Procedure Ordered: No orders of the defined types were placed in this encounter.   Imaging Ordered: None  Interventional Therapies: Scheduled:  None at this time.    Considering:   Palliative left L2-3 lumbar epidural steroid injection under fluoroscopic guidance and IV sedation.    PRN Procedures:   Palliative left L2-3 lumbar epidural steroid injection under fluoroscopic guidance and IV sedation.    Referral(s) or Consult(s): None at this time.  New Prescriptions   No medications on file    Medications administered during this visit: Lydia Hernandez had no medications administered during this visit.  Requested PM Follow-up: Return in 3 months (on 06/25/2016) for Med-Mgmt, In addition, (PRN) Procedure.  Future Appointments Date Time Provider [place]  06/25/2016 9:20 AM Milinda Pointer, MD [facility] None    Primary Care Physician: Gilford Rile, MD Location: [facility] Note by: Kathlen Brunswick. Dossie Arbour, M.D, DABA, DABAPM, DABPM, DABIPP, FIPP  Pain Score Disclaimer: We use the NRS-11 scale. This is a self-reported, subjective measurement of pain severity with only modest accuracy. It is used primarily to identify changes within a particular patient. It must be understood that outpatient pain scales are significantly less accurate that those used for research, where they can be applied under ideal controlled circumstances with minimal exposure to variables. In reality, the score is likely to be a combination of pain intensity and pain affect, where pain affect describes the degree of emotional arousal or changes in action readiness caused by the sensory experience of pain. Factors such as social and work situation, setting, emotional state, anxiety levels, expectation, and prior pain experience may influence pain perception and show large  inter-individual differences that may also be affected by time variables.  Patient instructions provided at this appointment:: Patient Instructions  Pain Management Discharge Instructions  General Discharge Instructions :  If you need to reach your doctor call: Monday-Friday 8:00 am - 4:00 pm at [phone] or toll free [phone].  After clinic hours [phone] to have operator reach doctor.  Bring all of your medication bottles to all your appointments in the pain clinic.  To cancel or reschedule your appointment with Pain Management please remember to call 24 hours in advance to avoid a fee.  Refer to the educational materials which you have been given on: General Risks, I had my Procedure. Discharge Instructions, Post Sedation.  Post Procedure Instructions:  The drugs you were given will stay in your system until tomorrow, so for the next 24 hours you should not drive, make any legal decisions or drink any alcoholic beverages.  You may eat anything you prefer, but it is better to start with liquids then soups and crackers, and gradually work up to solid foods.  Please notify your doctor immediately if you have any unusual bleeding, trouble breathing or pain that is not related to your normal pain.  Depending on the type of procedure that was done, some parts of your body may feel week and/or numb.  This usually clears up by tonight or the next day.  Walk with the use of an assistive device or accompanied by an adult for the 24 hours.  You may use ice on the affected area for the first 24 hours.  Put ice in a Ziploc bag and cover with a towel and place against area 15 minutes on  15 minutes off.  You may switch to heat after 24 hours.

## 2016-04-08 NOTE — Patient Instructions (Signed)

## 2016-05-14 ENCOUNTER — Ambulatory Visit: Payer: Medicare Other

## 2016-05-15 ENCOUNTER — Ambulatory Visit
Admission: RE | Admit: 2016-05-15 | Discharge: 2016-05-15 | Disposition: A | Discharge status: Home / Self Care | Payer: Medicare Other | Source: Ambulatory Visit | Attending: Pain Medicine | Admitting: Pain Medicine

## 2016-05-15 DIAGNOSIS — M542 Cervicalgia: Secondary | ICD-10-CM | POA: Diagnosis present

## 2016-05-15 DIAGNOSIS — M47812 Spondylosis without myelopathy or radiculopathy, cervical region: Secondary | ICD-10-CM | POA: Diagnosis present

## 2016-05-15 DIAGNOSIS — M9981 Other biomechanical lesions of cervical region: Secondary | ICD-10-CM | POA: Diagnosis present

## 2016-05-15 DIAGNOSIS — G8929 Other chronic pain: Secondary | ICD-10-CM | POA: Diagnosis not present

## 2016-05-15 DIAGNOSIS — M4802 Spinal stenosis, cervical region: Secondary | ICD-10-CM

## 2016-05-15 DIAGNOSIS — M25511 Pain in right shoulder: Principal | ICD-10-CM

## 2016-05-18 NOTE — Progress Notes (Signed)
Results were reviewed and found to be: abnormal, but not significant  Further testing may be useful  Review would suggest interventional pain management techniques may be of benefit 

## 2016-06-23 ENCOUNTER — Ambulatory Visit (HOSPITAL_BASED_OUTPATIENT_CLINIC_OR_DEPARTMENT_OTHER): Payer: Medicare Other | Admitting: Pain Medicine

## 2016-06-23 ENCOUNTER — Ambulatory Visit
Admission: RE | Admit: 2016-06-23 | Discharge: 2016-06-23 | Disposition: A | Discharge status: Home / Self Care | Payer: Medicare Other | Source: Ambulatory Visit | Attending: Pain Medicine | Admitting: Pain Medicine

## 2016-06-23 ENCOUNTER — Encounter: Payer: Self-pay | Admitting: Pain Medicine

## 2016-06-23 VITALS — BP 114/73 | HR 87 | Temp 97.9°F | Resp 14 | Ht 65.5 in | Wt 186.0 lb

## 2016-06-23 DIAGNOSIS — M5416 Radiculopathy, lumbar region: Secondary | ICD-10-CM

## 2016-06-23 DIAGNOSIS — G8929 Other chronic pain: Secondary | ICD-10-CM | POA: Insufficient documentation

## 2016-06-23 DIAGNOSIS — Z79899 Other long term (current) drug therapy: Secondary | ICD-10-CM | POA: Insufficient documentation

## 2016-06-23 DIAGNOSIS — M5442 Lumbago with sciatica, left side: Secondary | ICD-10-CM

## 2016-06-23 MED ORDER — IOPAMIDOL (ISOVUE-M 200) INJECTION 41%
INTRAMUSCULAR | Status: AC
  Administered 2016-06-23: 11:00:00
  Filled 2016-06-23: qty 10

## 2016-06-23 MED ORDER — MIDAZOLAM HCL 5 MG/5ML IJ SOLN: 1.0000 mg | INTRAMUSCULAR | Status: DC | PRN

## 2016-06-23 MED ORDER — ROPIVACAINE HCL 2 MG/ML IJ SOLN
INTRAMUSCULAR | Status: AC
  Administered 2016-06-23: 11:00:00
  Filled 2016-06-23: qty 10

## 2016-06-23 MED ORDER — ROPIVACAINE HCL 2 MG/ML IJ SOLN: 2.0000 mL | Freq: Once | INTRAMUSCULAR | Status: DC

## 2016-06-23 MED ORDER — FENTANYL CITRATE (PF) 100 MCG/2ML IJ SOLN
INTRAMUSCULAR | Status: AC
  Administered 2016-06-23: 100 ug via INTRAVENOUS
  Filled 2016-06-23: qty 2

## 2016-06-23 MED ORDER — TRIAMCINOLONE ACETONIDE 40 MG/ML IJ SUSP
INTRAMUSCULAR | Status: AC
  Administered 2016-06-23: 11:00:00
  Filled 2016-06-23: qty 1

## 2016-06-23 MED ORDER — TRIAMCINOLONE ACETONIDE 40 MG/ML IJ SUSP: 40.0000 mg | Freq: Once | INTRAMUSCULAR | Status: DC

## 2016-06-23 MED ORDER — SODIUM CHLORIDE 0.9 % IJ SOLN
INTRAMUSCULAR | Status: AC
  Administered 2016-06-23: 11:00:00
  Filled 2016-06-23: qty 10

## 2016-06-23 MED ORDER — IOPAMIDOL (ISOVUE-M 200) INJECTION 41%: 10.0000 mL | Freq: Once | INTRAMUSCULAR | Status: DC

## 2016-06-23 MED ORDER — MIDAZOLAM HCL 5 MG/5ML IJ SOLN
INTRAMUSCULAR | Status: AC
  Administered 2016-06-23: 3 mg via INTRAVENOUS
  Filled 2016-06-23: qty 5

## 2016-06-23 MED ORDER — SODIUM CHLORIDE 0.9% FLUSH: 2.0000 mL | Freq: Once | INTRAVENOUS | Status: DC

## 2016-06-23 MED ORDER — LIDOCAINE HCL (PF) 1 % IJ SOLN
INTRAMUSCULAR | Status: AC
  Administered 2016-06-23: 11:00:00
  Filled 2016-06-23: qty 5

## 2016-06-23 MED ORDER — LACTATED RINGERS IV SOLN: 1000.0000 mL | Freq: Once | INTRAVENOUS | Status: DC

## 2016-06-23 MED ORDER — FENTANYL CITRATE (PF) 100 MCG/2ML IJ SOLN: 25.0000 ug | INTRAMUSCULAR | Status: DC | PRN

## 2016-06-23 MED ORDER — LIDOCAINE HCL (PF) 1 % IJ SOLN: 10.0000 mL | Freq: Once | INTRAMUSCULAR | Status: DC

## 2016-06-23 NOTE — Progress Notes (Signed)
Patient's Name: Lydia Hernandez  MRN: [id number]  Referring Provider: [name], MD  DOB: 1956/03/26  PCP: Raina Mina, MD  DOS: 06/23/2016  Note by: Kathlen Brunswick. Dossie Arbour, MD  Service setting: Ambulatory outpatient  Location: [facility] (AMB) Pain Management Facility  Visit type: Procedure  Specialty: Interventional Pain Management  Patient type: Established   Primary Reason for Visit: Interventional Pain Management Treatment. CC: Back Pain (lower bilateral)  Procedure:  Anesthesia, Analgesia, Anxiolysis:  Type: Therapeutic Inter-Laminar Epidural Steroid Injection Region: Lumbar Level: L2-3 Level. Laterality: Left-Sided Paramedial  Type: Local Anesthesia with Moderate (Conscious) Sedation Local Anesthetic: Lidocaine 1% Route: Intravenous (IV) IV Access: Secured Sedation: Meaningful verbal contact was maintained at all times during the procedure  Indication(s): Analgesia and Anxiety  Indications: 1. Chronic lumbar radicular pain (Left)   2. Chronic low back pain (Location of Secondary source of pain) (Bilateral) (L>R)    Pain Score: Pre-procedure: 5 /10 Post-procedure: 5 /10 We'll need to go over the proper use of the pain scale with this patient since I really doubt the accuracy of this report. When the patient came in today she was clearly uncomfortable but in the recovery area she was sitting down with absolutely no evidence of being in pain and enjoying some crackers and soda, but when asked about pain she ache and indicated that it was a 5. This does not go along with the clinically observed pain level., Especially after the procedure. (Plan: Go over the pain scale with this patient once more.)  Pre-Procedure Assessment:  Lydia Hernandez is a 60 y.o. (year old), female patient, seen today for interventional treatment. She  has a past surgical history that includes Back surgery; Abdominal hysterectomy; Neck surgery; Ankle surgery (Right); Hernia repair; and Cholecystectomy.. Her primarily  concern today is the Back Pain (lower bilateral) The primary encounter diagnosis was Chronic lumbar radicular pain (Left). A diagnosis of Chronic low back pain (Location of Secondary source of pain) (Bilateral) (L>R) was also pertinent to this visit.  Pain Type: Chronic pain Pain Location: Back Pain Orientation: Lower, Left, Right Pain Descriptors / Indicators: Aching, Burning, Constant, Dull, Sharp Pain Frequency: Constant  Date of Last Visit: 04/08/16 Service Provided on Last Visit: Med Refill  Coagulation Parameters No results found for: INR, LABPROT, APTT, PLT Verification of the correct person, correct site (including marking of site), and correct procedure were performed and confirmed by the patient.  Consent: Before the procedure and under the influence of no sedative(s), amnesic(s), or anxiolytics, the patient was informed of the treatment options, risks and possible complications. To fulfill our ethical and legal obligations, as recommended by the American Medical Association's Code of Ethics, I have informed the patient of my clinical impression; the nature and purpose of the treatment or procedure; the risks, benefits, and possible complications of the intervention; the alternatives, including doing nothing; the risk(s) and benefit(s) of the alternative treatment(s) or procedure(s); and the risk(s) and benefit(s) of doing nothing. The patient was provided information about the general risks and possible complications associated with the procedure. These may include, but are not limited to: failure to achieve desired goals, infection, bleeding, organ or nerve damage, allergic reactions, paralysis, and death. In addition, the patient was informed of those risks and complications associated to Spine-related procedures, such as failure to decrease pain; infection (i.e.: Meningitis, epidural or intraspinal abscess); bleeding (i.e.: epidural hematoma, subarachnoid hemorrhage, or any other type  of intraspinal or peri-dural bleeding); organ or nerve damage (i.e.: Any type of peripheral  nerve, nerve root, or spinal cord injury) with subsequent damage to sensory, motor, and/or autonomic systems, resulting in permanent pain, numbness, and/or weakness of one or several areas of the body; allergic reactions; (i.e.: anaphylactic reaction); and/or death. Furthermore, the patient was informed of those risks and complications associated with the medications. These include, but are not limited to: allergic reactions (i.e.: anaphylactic or anaphylactoid reaction(s)); adrenal axis suppression; blood sugar elevation that in diabetics may result in ketoacidosis or comma; water retention that in patients with history of congestive heart failure may result in shortness of breath, pulmonary edema, and decompensation with resultant heart failure; weight gain; swelling or edema; medication-induced neural toxicity; particulate matter embolism and blood vessel occlusion with resultant organ, and/or nervous system infarction; and/or aseptic necrosis of one or more joints. Finally, the patient was informed that Medicine is not an exact science; therefore, there is also the possibility of unforeseen or unpredictable risks and/or possible complications that may result in a catastrophic outcome. The patient indicated having understood very clearly. We have given the patient no guarantees and we have made no promises. Enough time was given to the patient to ask questions, all of which were answered to the patient's satisfaction. Lydia Hernandez has indicated that she wanted to continue with the procedure.  Consent Attestation: I, the ordering provider, attest that I have discussed with the patient the benefits, risks, side-effects, alternatives, likelihood of achieving goals, and potential problems during recovery for the procedure that I have provided informed consent.  Pre-Procedure Preparation:  Safety Precautions: Allergies  reviewed. The patient was asked about blood thinners, or active infections, both of which were denied. The patient was asked to confirm the procedure and laterality, before marking the site, and again before commencing the procedure. Appropriate site, procedure, and patient were confirmed by following the Joint Commission's Universal Protocol (UP.01.01.01), in the form of a "Time Out". The patient was asked to participate by confirming the accuracy of the "Time Out" information. Patient was assessed for positional comfort and pressure points before starting the procedure. Allergies: She has No Known Allergies. Allergy Precautions: None required Infection Control Precautions: Sterile technique used. Standard Universal Precautions were taken as recommended by the Department of [facility] for Disease Control and Prevention (CDC). Standard pre-surgical skin prep was conducted. Respiratory hygiene and cough etiquette was practiced. Hand hygiene observed. Safe injection practices and needle disposal techniques followed. SDV (single dose vial) medications used. Medications properly checked for expiration dates and contaminants. Personal protective equipment (PPE) used as per protocol. Monitoring:  As per clinic protocol. Vitals:   06/23/16 1056 06/23/16 1106 06/23/16 1116 06/23/16 1126  BP: 125/84 124/81 115/76 114/73  Pulse:  88 85 87  Resp: 15 14 14 14   Temp:  97.9 F (36.6 C)    TempSrc:      SpO2: 100% 95% 96% 96%  Weight:      Height:      Calculated BMI: Body mass index is 30.48 kg/m. Time-out: "Time-out" completed before starting procedure, as per protocol.  Description of Procedure Process:   Position: Prone with head of the table was raised to facilitate breathing. Target Area: The interlaminar space, initially targeting the lower laminar border of the superior vertebral body. Approach: Paramedial approach. Area Prepped: Entire Posterior Lumbar Region Prepping solution:  ChloraPrep (2% chlorhexidine gluconate and 70% isopropyl alcohol) Safety Precautions: Aspiration looking for blood return was conducted prior to all injections. At no point did we inject any substances, as a needle was  being advanced. No attempts were made at seeking any paresthesias. Safe injection practices and needle disposal techniques used. Medications properly checked for expiration dates. SDV (single dose vial) medications used. Description of the Procedure: Protocol guidelines were followed. The procedure needle was introduced through the skin, ipsilateral to the reported pain, and advanced to the target area. Bone was contacted and the needle walked caudad, until the lamina was cleared. The epidural space was identified using "loss-of-resistance technique" with 2-3 ml of PF-NaCl (0.9% NSS), in a 5cc LOR glass syringe. EBL: None Materials & Medications:  Needle(s) Used: 20g - 10cm, Tuohy-style epidural needle Medication(s): see below.  Imaging Guidance (Spinal):  Type of Imaging Technique: Fluoroscopy Guidance (Spinal) Indication(s): Assistance in needle guidance and placement for procedures requiring needle placement in or near specific anatomical locations not easily accessible without such assistance. Exposure Time: Please see nurses notes. Contrast: Before injecting any contrast, we confirmed that the patient did not have an allergy to iodine, shellfish, or radiological contrast. Once satisfactory needle placement was completed at the desired level, radiological contrast was injected. Contrast injected under live fluoroscopy. No contrast complications. See chart for type and volume of contrast used. Fluoroscopic Guidance: I was personally present during the use of fluoroscopy. "Tunnel Vision Technique" used to obtain the best possible view of the target area. Parallax error corrected before commencing the procedure. "Direction-depth-direction" technique used to introduce the needle under  continuous pulsed fluoroscopy. Once target was reached, antero-posterior, oblique, and lateral fluoroscopic projection used confirm needle placement in all planes. Images permanently stored in EMR. Interpretation: I personally interpreted the imaging intraoperatively. Adequate needle placement confirmed in multiple planes. Appropriate spread of contrast into desired area was observed. No evidence of afferent or efferent intravascular uptake. No intrathecal or subarachnoid spread observed. Permanent images saved into the patient's record.  Antibiotic Prophylaxis:  Indication(s): No indications identified. Type:  Antibiotics Given (last 72 hours)    None      Post-operative Assessment:  Complications: No immediate post-treatment complications observed by team, or reported by patient. Disposition: The patient tolerated the entire procedure well. A repeat set of vitals were taken after the procedure and the patient was kept under observation following institutional policy, for this type of procedure. Post-procedural neurological assessment was performed, showing return to baseline, prior to discharge. The patient was provided with post-procedure discharge instructions, including a section on how to identify potential problems. Should any problems arise concerning this procedure, the patient was given instructions to immediately contact us, at any time, without hesitation. In any case, we plan to contact the patient by telephone for a follow-up status report regarding this interventional procedure. Comments:  No additional relevant information.  Plan of Care  Discharge to: Discharge home  Medications ordered for procedure: Meds ordered this encounter  Medications  . fentaNYL (SUBLIMAZE) injection 25-50 mcg    Make sure Narcan is available in the pyxis when using this medication. In the event of respiratory depression (RR< 8/min): Titrate NARCAN (naloxone) in increments of 0.1 to 0.2 mg IV at 2-3  minute intervals, until desired degree of reversal.  . lactated ringers infusion 1,000 mL  . midazolam (VERSED) 5 MG/5ML injection 1-2 mg    Make sure Flumazenil is available in the pyxis when using this medication. If oversedation occurs, administer 0.2 mg IV over 15 sec. If after 45 sec no response, administer 0.2 mg again over 1 min; may repeat at 1 min intervals; not to exceed 4 doses (1 mg)  .  iopamidol (ISOVUE-M) 41 % intrathecal injection 10 mL  . triamcinolone acetonide (KENALOG-40) injection 40 mg  . lidocaine (PF) (XYLOCAINE) 1 % injection 10 mL  . sodium chloride flush (NS) 0.9 % injection 2 mL  . ropivacaine (PF) 2 mg/mL (0.2%) (NAROPIN) injection 2 mL  . ropivacaine (PF) 2 mg/mL (0.2%) (NAROPIN) 2 MG/ML injection    Willeen Cass L: cabinet override  . iopamidol (ISOVUE-M) 41 % intrathecal injection    Willeen Cass L: cabinet override  . lidocaine (PF) (XYLOCAINE) 1 % injection    Willeen Cass L: cabinet override  . sodium chloride 0.9 % injection    Willeen Cass L: cabinet override  . triamcinolone acetonide (KENALOG-40) 40 MG/ML injection    Willeen Cass L: cabinet override  . fentaNYL (SUBLIMAZE) 100 MCG/2ML injection    Willeen Cass L: cabinet override  . midazolam (VERSED) 5 MG/5ML injection    Willeen Cass L: cabinet override   Medications administered: (For more details, see medical record) We administered ropivacaine (PF) 2 mg/mL (0.2%), iopamidol, lidocaine (PF), sodium chloride, triamcinolone acetonide, fentaNYL, and midazolam. Lab-work, Procedure(s), & Referral(s) Ordered: Orders Placed This Encounter  Procedures  . Lumbar Epidural Injection  . DG C-Arm 1-60 Min-No Report   Imaging Ordered: No results found for this or any previous visit. New Prescriptions   No medications on file   Physician-requested Follow-up:  Return in about 2 weeks (around 07/07/2016) for Post-Procedure evaluation.  Future Appointments Date Time Provider  [place]  06/26/2016 9:30 AM Milinda Pointer, MD [facility] None   Primary Care Physician: Raina Mina, MD Location: [facility] Note by: Kathlen Brunswick Dossie Arbour, M.D, DABA, DABAPM, DABPM, DABIPP, FIPP 06/24/1711:32 PM  Disclaimer:  Medicine is not an exact science. The only guarantee in medicine is that nothing is guaranteed. It is important to note that the decision to proceed with this intervention was based on the information collected from the patient. The Data and conclusions were drawn from the patient's questionnaire, the interview, and the physical examination. Because the information was provided in large part by the patient, it cannot be guaranteed that it has not been purposely or unconsciously manipulated. Every effort has been made to obtain as much relevant data as possible for this evaluation. It is important to note that the conclusions that lead to this procedure are derived in large part from the available data. Always take into account that the treatment will also be dependent on availability of resources and existing treatment guidelines, considered by other Pain Management Practitioners as being common knowledge and practice, at the time of the intervention. For Medico-Legal purposes, it is also important to point out that variation in procedural techniques and pharmacological choices are the acceptable norm. The indications, contraindications, technique, and results of the above procedure should only be interpreted and judged by a Board-Certified Interventional Pain Specialist with extensive familiarity and expertise in the same exact procedure and technique. Attempts at providing opinions without similar or greater experience and expertise than that of the treating physician will be considered as inappropriate and unethical, and shall result in a formal complaint to the state medical board and applicable specialty societies.  Instructions  provided at this appointment: Patient Instructions  Pain Management Discharge Instructions  General Discharge Instructions :  If you need to reach your doctor call: Monday-Friday 8:00 am - 4:00 pm at [phone] or toll free [phone].  After clinic hours [phone] to have operator reach doctor.  Bring all of your medication  bottles to all your appointments in the pain clinic.  To cancel or reschedule your appointment with Pain Management please remember to call 24 hours in advance to avoid a fee.  Refer to the educational materials which you have been given on: General Risks, I had my Procedure. Discharge Instructions, Post Sedation.  Post Procedure Instructions:  The drugs you were given will stay in your system until tomorrow, so for the next 24 hours you should not drive, make any legal decisions or drink any alcoholic beverages.  You may eat anything you prefer, but it is better to start with liquids then soups and crackers, and gradually work up to solid foods.  Please notify your doctor immediately if you have any unusual bleeding, trouble breathing or pain that is not related to your normal pain.  Depending on the type of procedure that was done, some parts of your body may feel week and/or numb.  This usually clears up by tonight or the next day.  Walk with the use of an assistive device or accompanied by an adult for the 24 hours.  You may use ice on the affected area for the first 24 hours.  Put ice in a Ziploc bag and cover with a towel and place against area 15 minutes on 15 minutes off.  You may switch to heat after 24 hours.Epidural Steroid Injection An epidural steroid injection is a shot of steroid medicine and numbing medicine that is given into the space between the spinal cord and the bones in your back (epidural space). The shot helps relieve pain caused by an irritated or swollen nerve root. The amount of pain relief you get from the injection depends on  what is causing the nerve to be swollen and irritated, and how long your pain lasts. You are more likely to benefit from this injection if your pain is strong and comes on suddenly rather than if you have had pain for a long time. Tell a health care provider about:  Any allergies you have.  All medicines you are taking, including vitamins, herbs, eye drops, creams, and over-the-counter medicines.  Any problems you or family members have had with anesthetic medicines.  Any blood disorders you have.  Any surgeries you have had.  Any medical conditions you have.  Whether you are pregnant or may be pregnant. What are the risks? Generally, this is a safe procedure. However, problems may occur, including:  Headache.  Bleeding.  Infection.  Allergic reaction to medicines.  Damage to your nerves. What happens before the procedure? Staying hydrated  Follow instructions from your health care provider about hydration, which may include:  Up to 2 hours before the procedure - you may continue to drink clear liquids, such as water, clear fruit juice, black coffee, and plain tea. Eating and drinking restrictions  Follow instructions from your health care provider about eating and drinking, which may include:  8 hours before the procedure - stop eating heavy meals or foods such as meat, fried foods, or fatty foods.  6 hours before the procedure - stop eating light meals or foods, such as toast or cereal.  6 hours before the procedure - stop drinking milk or drinks that contain milk.  2 hours before the procedure - stop drinking clear liquids. Medicine  You may be given medicines to lower anxiety.  Ask your health care provider about:  Changing or stopping your regular medicines. This is especially important if you are taking diabetes medicines or blood  thinners.  Taking medicines such as aspirin and ibuprofen. These medicines can thin your blood. Do not take these medicines before  your procedure if your health care provider instructs you not to. General instructions  Plan to have someone take you home from the hospital or clinic. What happens during the procedure?  You may receive a medicine to help you relax (sedative).  You will be asked to lie on your abdomen.  The injection site will be cleaned.  A numbing medicine (local anesthetic) will be used to numb the injection site.  A needle will be inserted through your skin into the epidural space. You may feel some discomfort when this happens. An X-ray machine will be used to make sure the needle is put as close as possible to the affected nerve.  A steroid medicine and a local anesthetic will be injected into the epidural space.  The needle will be removed.  A bandage (dressing) will be put over the injection site. What happens after the procedure?  Your blood pressure, heart rate, breathing rate, and blood oxygen level will be monitored until the medicines you were given have worn off.  Your arm or leg may feel weak or numb for a few hours.  The injection site may feel sore.  Do not drive for 24 hours if you received a sedative. This information is not intended to replace advice given to you by your health care provider. Make sure you discuss any questions you have with your health care provider. Document Released: 10/14/2007 Document Revised: 12/19/2015 Document Reviewed: 10/23/2015 Elsevier Interactive Patient Education  2017 [employer].

## 2016-06-23 NOTE — Patient Instructions (Signed)
Pain Management Discharge Instructions  General Discharge Instructions :  If you need to reach your doctor call: Monday-Friday 8:00 am - 4:00 pm at [phone] or toll free [phone].  After clinic hours [phone] to have operator reach doctor.  Bring all of your medication bottles to all your appointments in the pain clinic.  To cancel or reschedule your appointment with Pain Management please remember to call 24 hours in advance to avoid a fee.  Refer to the educational materials which you have been given on: General Risks, I had my Procedure. Discharge Instructions, Post Sedation.  Post Procedure Instructions:  The drugs you were given will stay in your system until tomorrow, so for the next 24 hours you should not drive, make any legal decisions or drink any alcoholic beverages.  You may eat anything you prefer, but it is better to start with liquids then soups and crackers, and gradually work up to solid foods.  Please notify your doctor immediately if you have any unusual bleeding, trouble breathing or pain that is not related to your normal pain.  Depending on the type of procedure that was done, some parts of your body may feel week and/or numb.  This usually clears up by tonight or the next day.  Walk with the use of an assistive device or accompanied by an adult for the 24 hours.  You may use ice on the affected area for the first 24 hours.  Put ice in a Ziploc bag and cover with a towel and place against area 15 minutes on 15 minutes off.  You may switch to heat after 24 hours.Epidural Steroid Injection An epidural steroid injection is a shot of steroid medicine and numbing medicine that is given into the space between the spinal cord and the bones in your back (epidural space). The shot helps relieve pain caused by an irritated or swollen nerve root. The amount of pain relief you get from the injection depends on what is causing the nerve to be swollen and irritated,  and how long your pain lasts. You are more likely to benefit from this injection if your pain is strong and comes on suddenly rather than if you have had pain for a long time. Tell a health care provider about:  Any allergies you have.  All medicines you are taking, including vitamins, herbs, eye drops, creams, and over-the-counter medicines.  Any problems you or family members have had with anesthetic medicines.  Any blood disorders you have.  Any surgeries you have had.  Any medical conditions you have.  Whether you are pregnant or may be pregnant. What are the risks? Generally, this is a safe procedure. However, problems may occur, including:  Headache.  Bleeding.  Infection.  Allergic reaction to medicines.  Damage to your nerves. What happens before the procedure? Staying hydrated  Follow instructions from your health care provider about hydration, which may include:  Up to 2 hours before the procedure - you may continue to drink clear liquids, such as water, clear fruit juice, black coffee, and plain tea. Eating and drinking restrictions  Follow instructions from your health care provider about eating and drinking, which may include:  8 hours before the procedure - stop eating heavy meals or foods such as meat, fried foods, or fatty foods.  6 hours before the procedure - stop eating light meals or foods, such as toast or cereal.  6 hours before the procedure - stop drinking milk or drinks that contain milk.    2 hours before the procedure - stop drinking clear liquids. Medicine  You may be given medicines to lower anxiety.  Ask your health care provider about:  Changing or stopping your regular medicines. This is especially important if you are taking diabetes medicines or blood thinners.  Taking medicines such as aspirin and ibuprofen. These medicines can thin your blood. Do not take these medicines before your procedure if your health care provider instructs  you not to. General instructions  Plan to have someone take you home from the hospital or clinic. What happens during the procedure?  You may receive a medicine to help you relax (sedative).  You will be asked to lie on your abdomen.  The injection site will be cleaned.  A numbing medicine (local anesthetic) will be used to numb the injection site.  A needle will be inserted through your skin into the epidural space. You may feel some discomfort when this happens. An X-ray machine will be used to make sure the needle is put as close as possible to the affected nerve.  A steroid medicine and a local anesthetic will be injected into the epidural space.  The needle will be removed.  A bandage (dressing) will be put over the injection site. What happens after the procedure?  Your blood pressure, heart rate, breathing rate, and blood oxygen level will be monitored until the medicines you were given have worn off.  Your arm or leg may feel weak or numb for a few hours.  The injection site may feel sore.  Do not drive for 24 hours if you received a sedative. This information is not intended to replace advice given to you by your health care provider. Make sure you discuss any questions you have with your health care provider. Document Released: 10/14/2007 Document Revised: 12/19/2015 Document Reviewed: 10/23/2015 [employer] Interactive Patient Education  2017 [employer]  

## 2016-06-23 NOTE — Progress Notes (Signed)
Safety precautions to be maintained throughout the outpatient stay will include: orient to surroundings, keep bed in low position, maintain call bell within reach at all times, provide assistance with transfer out of bed and ambulation.  

## 2016-06-24 ENCOUNTER — Telehealth: Payer: Self-pay | Admitting: *Deleted

## 2016-06-24 NOTE — Telephone Encounter (Signed)
No problems post procedure. 

## 2016-06-25 ENCOUNTER — Encounter: Payer: Medicare Other | Admitting: Pain Medicine

## 2016-06-26 ENCOUNTER — Ambulatory Visit: Payer: Medicare Other | Admitting: Pain Medicine

## 2016-06-26 NOTE — Progress Notes (Deleted)
Patient's Name: Lydia Hernandez  MRN: [id number]  Referring Provider: [name], MD  DOB: October 01, 1955  PCP: Raina Mina, MD  DOS: 06/26/2016  Note by: Kathlen Brunswick. Dossie Arbour, MD  Service setting: Ambulatory outpatient  Specialty: Interventional Pain Management  Location: [facility] (AMB) Pain Management Facility    Patient type: Established   Primary Reason(s) for Visit: Encounter for prescription drug management & post-procedure evaluation of chronic illness with mild to moderate exacerbation(Level of risk: moderate) CC: No chief complaint on file.  HPI  Lydia Hernandez is a 60 y.o. year old, female patient, who comes today for a post-procedure evaluation and medication management. She has Chronic pain syndrome; Chronic low back pain (Location of Secondary source of pain) (Bilateral) (L>R); Failed back surgical syndrome; Lumbar Postlaminectomy Syndrome; Chronic lumbar radicular pain (Left); Long term current use of opiate analgesic; Long term prescription opiate use; Opiate use (213.75 MME/Day); Opiate dependence ([place]); Encounter for therapeutic drug level monitoring; Lumbar spondylosis; Epidural fibrosis; Lumbar facet syndrome (Bilateral) (L>R); Chronic sacroiliac joint pain (Bilateral); Chronic lower extremity pain (Bilateral) (L>R); Essential hypertension; History of tobacco abuse; Generalized anxiety disorder; Depression; Hiatal hernia; GERD (gastroesophageal reflux disease); Irritable bowel syndrome; Chronic constipation; Opioid-induced constipation (OIC); Hypothyroidism; Fibromyalgia; Neurogenic pain; Vitamin D insufficiency; Osteoporosis; Vitamin B12 deficiency; Chronic neck pain (Location of Tertiary source of pain) (Bilateral) (R>L); Cervical spondylosis; Lumbar facet hypertrophy; 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 Her primarily concern  today is the No chief complaint on file.  Pain Assessment: Self-Reported Pain Score:  /10             Reported level is compatible with observation.          [name] was last seen on 06/23/2016 for a procedure. During today's appointment we reviewed [name]'s post-procedure results, as well as her outpatient medication regimen. Today the patient was given a final warning with regards to compliant with our request to get her lab work done to monitor her liver and kidney function. She was informed that if we do not get this done in the next 7 days, we will not be doing any more refills.  Further details on both, my assessment(s), as well as the proposed treatment plan, please see below.  Controlled Substance Pharmacotherapy Assessment REMS (Risk Evaluation and Mitigation Strategy)  Analgesic:Duragesic 75 mcg/h every 72 hours + hydrocodone/APAP 7.5/325 one every 8 hours (22.5 mg/day) MME/day:213.75 mg/day.  No notes on file Pharmacokinetics: Liberation and absorption (onset of action): WNL Distribution (time to peak effect): WNL Metabolism and excretion (duration of action): WNL         Pharmacodynamics: Desired effects: Analgesia: Lydia Hernandez reports >50% benefit. Functional ability: Patient reports that medication allows her to accomplish basic ADLs Clinically meaningful improvement in function (CMIF): Sustained CMIF goals met Perceived effectiveness: Described as relatively effective, allowing for increase in activities of daily living (ADL) Undesirable effects: Side-effects or Adverse reactions: None reported Monitoring: [place] PMP: Online review of the past 35-monthperiod conducted. Compliant with practice rules and regulations List of all UDS test(s) done:  Lab Results  Component Value Date   TOXASSSELUR FINAL 08/06/2015   TOXASSSELUR FINAL 05/22/2015   Last UDS on record: ToxAssure Select 13  Date Value Ref Range Status  08/06/2015 FINAL  Final    Comment:     ==================================================================== TOXASSURE SELECT 13 (MW) ==================================================================== Test  Result       Flag       Units Drug Present and Declared for Prescription Verification   Hydrocodone                    985          EXPECTED   ng/mg creat   Hydromorphone                  154          EXPECTED   ng/mg creat   Norhydrocodone                 1439         EXPECTED   ng/mg creat    Sources of hydrocodone include scheduled prescription    medications. Hydromorphone and norhydrocodone are expected    metabolites of hydrocodone. Hydromorphone is also available as a    scheduled prescription medication.   Fentanyl                       34           EXPECTED   ng/mg creat   Norfentanyl                    188          EXPECTED   ng/mg creat    Source of fentanyl is a scheduled prescription medication,    including IV, patch, and transmucosal formulations. Norfentanyl    is an expected metabolite of fentanyl. ==================================================================== Test                      Result    Flag   Units      Ref Range   Creatinine              67               mg/dL      >=20 ==================================================================== Declared Medications:  The flagging and interpretation on this report are based on the  following declared medications.  Unexpected results may arise from  inaccuracies in the declared medications.  **Note: The testing scope of this panel includes these medications:  Fentanyl  Hydrocodone  **Note: The testing scope of this panel does not include following  reported medications:  Gabapentin ==================================================================== For clinical consultation, please call [phone]. ====================================================================    UDS interpretation: Compliant           Medication Assessment Form: Reviewed. Patient indicates being compliant with therapy Treatment compliance: Compliant Risk Assessment Profile: Aberrant behavior: See prior evaluations. None observed or detected today Comorbid factors increasing risk of overdose: See prior notes. No additional risks detected today Risk of substance use disorder (SUD): Low Opioid Risk Tool (ORT) Total Score:    Interpretation Table:  Score <3 = Low Risk for SUD  Score between 4-7 = Moderate Risk for SUD  Score >8 = High Risk for Opioid Abuse   Risk Mitigation Strategies:  Patient Counseling: Covered Patient-Prescriber Agreement (PPA): Present and active  Notification to other healthcare providers: Done  Pharmacologic Plan: No change in therapy, at this time  Post-Procedure Assessment  06/23/2016 Procedure: *** Post-procedure pain score: 0/10         Influential Factors: BMI:   Intra-procedural challenges: None observed Assessment challenges: None detected         Post-procedural side-effects, adverse reactions, or complications: None reported Reported  issues: None  Sedation: Please see nurses note. When no sedatives are used, the analgesic levels obtained are directly associated to the effectiveness of the local anesthetics. However, when sedation is provided, the level of analgesia obtained during the initial 1 hour following the intervention, is believed to be the result of a combination of factors. These factors may include, but are not limited to: 1. The effectiveness of the local anesthetics used. 2. The effects of the analgesic(s) and/or anxiolytic(s) used. 3. The degree of discomfort experienced by the patient at the time of the procedure. 4. The patients ability and reliability in recalling and recording the events. 5. The presence and influence of possible secondary gains and/or psychosocial factors. Reported result: Relief experienced during the 1st hour after the procedure:   (Ultra-Short  Term Relief) Interpretative annotation: Analgesia during this period is likely to be Local Anesthetic and/or IV Sedative (Analgesic/Anxiolitic) related.          Effects of local anesthetic: The analgesic effects attained during this period are directly associated to the localized infiltration of local anesthetics and therefore cary significant diagnostic value as to the etiological location, or anatomical origin, of the pain. Expected duration of relief is directly dependent on the pharmacodynamics of the local anesthetic used. Long-acting (4-6 hours) anesthetics used.  Reported result: Relief during the next 4 to 6 hour after the procedure:   (Short-Term Relief) Interpretative annotation: Complete relief would suggest area to be the source of the pain.          Long-term benefit: Defined as the period of time past the expected duration of local anesthetics. With the possible exception of prolonged sympathetic blockade from the local anesthetics, benefits during this period are typically attributed to, or associated with, other factors such as analgesic sensory neuropraxia, antiinflammatory effects, or beneficial biochemical changes provided by agents other than the local anesthetics Reported result: Extended relief following procedure:   (Long-Term Relief) Interpretative annotation: Good relief. This could suggest inflammation to be a significant component in the etiology to the pain.          Current benefits: Defined as persistent relief that continues at this point in time.   Reported results: Treated area: *** %       Interpretative annotation: Ongoing benefits would suggest effective therapeutic approach  Interpretation: Results would suggest a successful diagnostic intervention.          Laboratory Chemistry  Inflammation Markers No results found for: ESRSEDRATE, CRP Renal Function No results found for: BUN, CREATININE, GFRAA, GFRNONAA Hepatic Function No results found for: AST, ALT,  ALBUMIN Electrolytes No results found for: NA, K, CL, CALCIUM, MG Pain Modulating Vitamins No results found for: [place], [id number], [id number], [id number], 25OHVITD1, 25OHVITD2, 25OHVITD3, VITAMINB12 Coagulation Parameters No results found for: INR, LABPROT, APTT, PLT Cardiovascular No results found for: BNP, HGB, HCT Note: Lab results reviewed.  Recent Diagnostic Imaging Review  Dg C-arm 1-60 Min-no Report  Result Date: 06/23/2016 There is no report for this exam.  Note: Imaging results reviewed.          Meds  The patient has a current medication list which includes the following prescription(s): benzonatate, cetirizine, cyclobenzaprine, estrogen (conjugated)-medroxyprogesterone, fentanyl, fentanyl, fentanyl, furosemide, gabapentin, hydrochlorothiazide, hydrocodone-acetaminophen, hydrocodone-acetaminophen, hydrocodone-acetaminophen, medroxyprogesterone, meloxicam, metoprolol succinate, pantoprazole, potassium chloride, and travoprost (benzalkonium).  Current Outpatient Prescriptions on File Prior to Visit  Medication Sig  . benzonatate (TESSALON) 100 MG capsule Take 100 mg by mouth.  . cetirizine (ZYRTEC) 10 MG tablet Take 10 mg  by mouth daily.  . cyclobenzaprine (FLEXERIL) 10 MG tablet Take 1 tablet (10 mg total) by mouth 3 (three) times daily as needed for muscle spasms.  Marland Kitchen estrogen, conjugated,-medroxyprogesterone (PREMPRO) 0.3-1.5 MG tablet Take 1 tablet by mouth daily.  . fentaNYL (DURAGESIC - DOSED MCG/HR) 75 MCG/HR Place 1 patch (75 mcg total) onto the skin every 3 (three) days.  . fentaNYL (DURAGESIC - DOSED MCG/HR) 75 MCG/HR Place 1 patch (75 mcg total) onto the skin every 3 (three) days.  . fentaNYL (DURAGESIC - DOSED MCG/HR) 75 MCG/HR Place 1 patch (75 mcg total) onto the skin every 3 (three) days.  . furosemide (LASIX) 20 MG tablet Take 20 mg by mouth as needed.   . gabapentin (NEURONTIN) 300 MG capsule Take 1 capsule (300 mg total) by mouth 4 (four) times daily.  .  hydrochlorothiazide (MICROZIDE) 12.5 MG capsule Take 12.5 mg by mouth daily. Reported on 08/06/2015  . HYDROcodone-acetaminophen (NORCO) 7.5-325 MG tablet Take 1 tablet by mouth every 8 (eight) hours as needed for severe pain.  Marland Kitchen HYDROcodone-acetaminophen (NORCO) 7.5-325 MG tablet Take 1 tablet by mouth every 8 (eight) hours as needed for severe pain.  Marland Kitchen HYDROcodone-acetaminophen (NORCO) 7.5-325 MG tablet Take 1 tablet by mouth every 8 (eight) hours as needed for severe pain.  . medroxyPROGESTERone (PROVERA) 5 MG tablet Take 5 mg by mouth daily.   . meloxicam (MOBIC) 15 MG tablet Take 1 tablet (15 mg total) by mouth daily.  . metoprolol succinate (TOPROL-XL) 25 MG 24 hr tablet Take 25 mg by mouth daily.   . pantoprazole (PROTONIX) 20 MG tablet Take 20 mg by mouth daily.  . potassium chloride (KLOR-CON) 8 MEQ tablet Take 8 mEq by mouth daily as needed. For cramps.  . travoprost, benzalkonium, (TRAVATAN) 0.004 % ophthalmic solution Place 1 drop into both eyes at bedtime.    No current facility-administered medications on file prior to visit.    ROS  Constitutional: Denies any fever or chills Gastrointestinal: No reported hemesis, hematochezia, vomiting, or acute GI distress Musculoskeletal: Denies any acute onset joint swelling, redness, loss of ROM, or weakness Neurological: No reported episodes of acute onset apraxia, aphasia, dysarthria, agnosia, amnesia, paralysis, loss of coordination, or loss of consciousness  Allergies  Ms. Braxton has No Known Allergies.  PFSH  Drug: Ms. Moragne  reports that she does not use drugs. Alcohol:  reports that she does not drink alcohol. Tobacco:  reports that she has been smoking.  She has been smoking about 0.20 packs per day. She has never used smokeless tobacco. Medical:  has a past medical history of Anxiety; Chronic back pain; Depression; Fibromyalgia; GERD (gastroesophageal reflux disease); Hiatal hernia; History of tobacco abuse (05/23/2015);  Hypertension; Hypothyroidism; IBS (irritable bowel syndrome); Musculoskeletal pain (05/23/2015); Neuropathic pain (05/23/2015); and Osteoarthritis. Family: family history includes Cancer in her father and mother.  Past Surgical History:  Procedure Laterality Date  . ABDOMINAL HYSTERECTOMY    . ANKLE SURGERY Right   . BACK SURGERY    . CHOLECYSTECTOMY    . HERNIA REPAIR    . NECK SURGERY     Constitutional Exam  General appearance: Well nourished, well developed, and well hydrated. In no apparent acute distress There were no vitals filed for this visit. BMI Assessment: Estimated body mass index is 30.48 kg/m as calculated from the following:   Height as of 06/23/16: 5' 5.5" (1.664 m).   Weight as of 06/23/16: 186 lb (84.4 kg).  BMI interpretation table: BMI level Category Range  association with higher incidence of chronic pain  <18 kg/m2 Underweight   18.5-24.9 kg/m2 Ideal body weight   25-29.9 kg/m2 Overweight Increased incidence by 20%  30-34.9 kg/m2 Obese (Class I) Increased incidence by 68%  35-39.9 kg/m2 Severe obesity (Class II) Increased incidence by 136%  >40 kg/m2 Extreme obesity (Class III) Increased incidence by 254%   BMI Readings from Last 4 Encounters:  06/23/16 30.48 kg/m  04/08/16 32.12 kg/m  03/25/16 32.12 kg/m  01/10/16 33.12 kg/m   Wt Readings from Last 4 Encounters:  06/23/16 186 lb (84.4 kg)  04/08/16 193 lb (87.5 kg)  03/25/16 193 lb (87.5 kg)  01/10/16 199 lb (90.3 kg)  Psych/Mental status: Alert, oriented x 3 (person, place, & time) Eyes: PERLA Respiratory: No evidence of acute respiratory distress  Cervical Spine Exam  Inspection: No masses, redness, or swelling Alignment: Symmetrical Functional ROM: Unrestricted ROM Stability: No instability detected Muscle strength & Tone: Functionally intact Sensory: Unimpaired Palpation: Non-contributory  Upper Extremity (UE) Exam    Side: Right upper extremity  Side: Left upper extremity  Inspection:  No masses, redness, swelling, or asymmetry  Inspection: No masses, redness, swelling, or asymmetry  Functional ROM: Unrestricted ROM          Functional ROM: Unrestricted ROM          Muscle strength & Tone: Functionally intact  Muscle strength & Tone: Functionally intact  Sensory: Unimpaired  Sensory: Unimpaired  Palpation: Non-contributory  Palpation: Non-contributory   Thoracic Spine Exam  Inspection: No masses, redness, or swelling Alignment: Symmetrical Functional ROM: Unrestricted ROM Stability: No instability detected Sensory: Unimpaired Muscle strength & Tone: Functionally intact Palpation: Non-contributory  Lumbar Spine Exam  Inspection: Well healed scar from previous spine surgery detected Alignment: Symmetrical Functional ROM: Fused Stability: No instability detected Muscle strength & Tone: Functionally intact Sensory: Movement-associated discomfort Palpation: Complains of area being tender to palpation Provocative Tests: Lumbar Hyperextension and rotation test: evaluation deferred today       Patrick's Maneuver: evaluation deferred today              Gait & Posture Assessment  Ambulation: Unassisted Gait: Relatively normal for age and body habitus Posture: WNL   Lower Extremity Exam    Side: Right lower extremity  Side: Left lower extremity  Inspection: No masses, redness, swelling, or asymmetry  Inspection: No masses, redness, swelling, or asymmetry  Functional ROM: Unrestricted ROM          Functional ROM: Unrestricted ROM          Muscle strength & Tone: Functionally intact  Muscle strength & Tone: Functionally intact  Sensory: Unimpaired  Sensory: Unimpaired  Palpation: Non-contributory  Palpation: Non-contributory   Assessment  Primary Diagnosis & Pertinent Problem List: The primary encounter diagnosis was Chronic pain syndrome. Diagnoses of Long term prescription opiate use, Opiate use (213.75 MME/Day), Lumbar Postlaminectomy Syndrome, Chronic low back pain  (Location of Secondary source of pain) (Bilateral) (L>R), and Chronic neck pain (Location of Tertiary source of pain) (Bilateral) (R>L) were also pertinent to this visit.  Visit Diagnosis: 1. Chronic pain syndrome   2. Long term prescription opiate use   3. Opiate use (213.75 MME/Day)   4. Lumbar Postlaminectomy Syndrome   5. Chronic low back pain (Location of Secondary source of pain) (Bilateral) (L>R)   6. Chronic neck pain (Location of Tertiary source of pain) (Bilateral) (R>L)    Plan of Care  Pharmacotherapy (Medications Ordered): No orders of the defined types were placed in this  encounter.  New Prescriptions   No medications on file   Medications administered today: Ms. Gores had no medications administered during this visit. Lab-work, procedure(s), and/or referral(s): No orders of the defined types were placed in this encounter.  Imaging and/or referral(s): None  Interventional therapies: Planned, scheduled, and/or pending:   Will try to keep the palliative epidural to more normal more than one every other month (6 per year).    Considering:   Palliative left L2-3 lumbar epidural steroid injection under fluoroscopic guidance and IV sedation.    Palliative PRN treatment(s):   Palliative left L2-3 lumbar epidural steroid injection under fluoroscopic guidance and IV sedation.    Provider-requested follow-up: No Follow-up on file.  Future Appointments Date Time Provider [place]  06/26/2016 9:30 AM Milinda Pointer, MD [facility] None   Primary Care Physician: Raina Mina, MD Location: [facility] Note by: Kathlen Brunswick Dossie Arbour, M.D, DABA, DABAPM, DABPM, DABIPP, FIPP Date: 06/26/16; Time: 8:40 AM  Pain Score Disclaimer: We use the NRS-11 scale. This is a self-reported, subjective measurement of pain severity with only modest accuracy. It is used primarily to identify changes within a particular patient. It must be understood  that outpatient pain scales are significantly less accurate that those used for research, where they can be applied under ideal controlled circumstances with minimal exposure to variables. In reality, the score is likely to be a combination of pain intensity and pain affect, where pain affect describes the degree of emotional arousal or changes in action readiness caused by the sensory experience of pain. Factors such as social and work situation, setting, emotional state, anxiety levels, expectation, and prior pain experience may influence pain perception and show large inter-individual differences that may also be affected by time variables.  Patient instructions provided during this appointment: There are no Patient Instructions on file for this visit.

## 2016-07-03 ENCOUNTER — Ambulatory Visit: Payer: Medicare Other | Admitting: Pain Medicine

## 2016-07-16 ENCOUNTER — Telehealth: Payer: Self-pay | Admitting: Pain Medicine

## 2016-07-16 NOTE — Telephone Encounter (Signed)
Patient called stating she had to go to ER on Monday, 07-14-16. They gave her prednisone and 4 pain pills, still hurting and wants to speak with someone about what to do.

## 2016-07-16 NOTE — Telephone Encounter (Signed)
Called patient to find out about pain and ED visit.  Patient states that just before Christmas she had a flare up of her neck and shoulder on the Right side for which she went to the ED for treatment.  She was prescribed prednisone and oxycodone for her pain and told to f/up with our clinic.  Patient continues to have the pain for which she was treated for on 07/14/16.  Patient is also doing stretching exercises that were recommended by ED provider which she states are not helping and also using heat and ice.  Explained that Dr Dossie Arbour is out of the office until after the first of the year.  Told patient that she could possibly f/up with her PCP or if the pain was so extreme, she could return to the ED for further evaluation.  Patient transferred up front for scheduling of appt time when Dr Dossie Arbour returns back to office.  Patient verbalizes u/o information.

## 2016-07-24 ENCOUNTER — Ambulatory Visit: Payer: Medicare Other | Admitting: Pain Medicine

## 2016-07-24 ENCOUNTER — Other Ambulatory Visit: Payer: Self-pay | Admitting: Pain Medicine

## 2016-07-24 ENCOUNTER — Telehealth: Payer: Self-pay | Admitting: Pain Medicine

## 2016-07-24 DIAGNOSIS — F1190 Opioid use, unspecified, uncomplicated: Secondary | ICD-10-CM

## 2016-07-24 DIAGNOSIS — M5416 Radiculopathy, lumbar region: Secondary | ICD-10-CM

## 2016-07-24 DIAGNOSIS — M961 Postlaminectomy syndrome, not elsewhere classified: Secondary | ICD-10-CM

## 2016-07-24 DIAGNOSIS — M542 Cervicalgia: Secondary | ICD-10-CM

## 2016-07-24 DIAGNOSIS — G8929 Other chronic pain: Secondary | ICD-10-CM

## 2016-07-24 DIAGNOSIS — Z79891 Long term (current) use of opiate analgesic: Secondary | ICD-10-CM

## 2016-07-24 DIAGNOSIS — M79604 Pain in right leg: Secondary | ICD-10-CM

## 2016-07-24 DIAGNOSIS — G894 Chronic pain syndrome: Secondary | ICD-10-CM

## 2016-07-24 DIAGNOSIS — M79605 Pain in left leg: Secondary | ICD-10-CM

## 2016-07-24 DIAGNOSIS — M5442 Lumbago with sciatica, left side: Secondary | ICD-10-CM

## 2016-07-24 NOTE — Telephone Encounter (Signed)
Mrs. Maki called stating she is in a lot of pain in neck and shoulders, has appt at 9:45 but is not sure she can get here due to snowy roads, would like to speak with nurse as soon as possible

## 2016-07-24 NOTE — Telephone Encounter (Signed)
Do we leave med refill appt she has scheduled ?

## 2016-07-24 NOTE — Telephone Encounter (Signed)
Spoke with patient.  States she has to cancel todays appointment but would be going to [facility] hospital ED for treatment.  Spoke with Dr Dossie Arbour.  Due to patients continued cancellation of appointments, we will be referring her to another pain clinic.  Patient notified and states understanding.

## 2016-08-05 ENCOUNTER — Ambulatory Visit: Payer: Medicare Other | Admitting: Pain Medicine

## 2016-08-12 ENCOUNTER — Telehealth: Payer: Self-pay | Admitting: Pain Medicine

## 2016-08-12 NOTE — Telephone Encounter (Addendum)
Lydia Hernandez, Emonni's sister called stating patient would like to be referred to pain clinic in [place], not sure of name, maybe [facility]. Please call patient to let her know if she can go there. In a lot of pain and needs to get in soon. Ok so this will be  [facility] and I will send referral info over to them

## 2016-09-08 ENCOUNTER — Encounter: Payer: Medicare Other | Admitting: Pain Medicine

## 2016-09-12 ENCOUNTER — Other Ambulatory Visit: Payer: Self-pay | Admitting: Neurosurgery

## 2016-09-12 DIAGNOSIS — M5412 Radiculopathy, cervical region: Secondary | ICD-10-CM

## 2016-10-06 ENCOUNTER — Ambulatory Visit
Admission: RE | Admit: 2016-10-06 | Discharge: 2016-10-06 | Disposition: A | Discharge status: Home / Self Care | Payer: Medicare Other | Source: Ambulatory Visit | Attending: Neurosurgery | Admitting: Neurosurgery

## 2016-10-06 DIAGNOSIS — M5412 Radiculopathy, cervical region: Secondary | ICD-10-CM

## 2016-10-09 ENCOUNTER — Other Ambulatory Visit: Payer: Self-pay | Admitting: Neurosurgery

## 2016-10-26 IMAGING — CT CT CERVICAL SPINE W/O CM
3 of 4 series · 11 of 33 positions shown, 13 images · non-contrast
Comparison: Cervical spine MRI 05/21/2012. CT cervical spine
04/23/2012.

CLINICAL DATA: Right neck pain with pain and numbness in the right
arm. Symptoms are chronic. No known injury.

EXAM:
CT CERVICAL SPINE WITHOUT CONTRAST
TECHNIQUE: Multidetector CT imaging of the cervical spine was performed without
intravenous contrast. Multiplanar CT image reconstructions were also
generated.

[Series 6: sagittal bone · sagittal · 0.27mm/px · 5 of 49 slices shown, 6 images]
[im 17/49  bone]
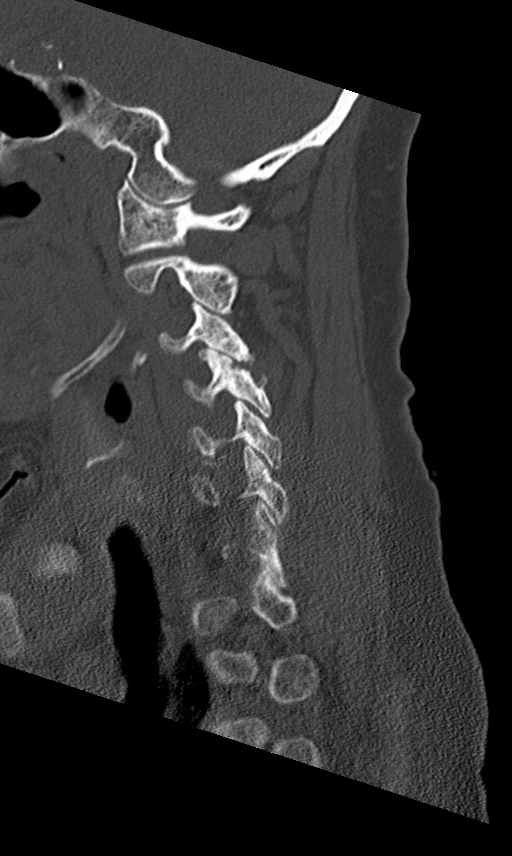
[im 21/49  bone]
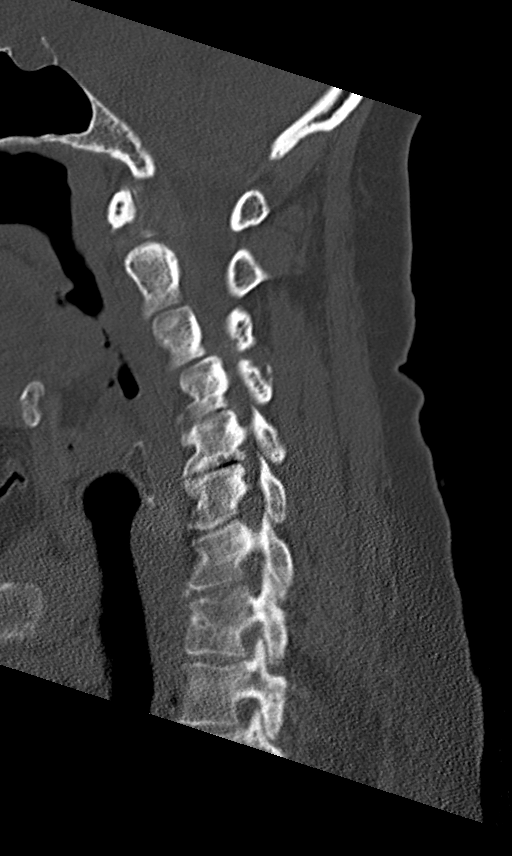
[im 25/49  soft-tissue]
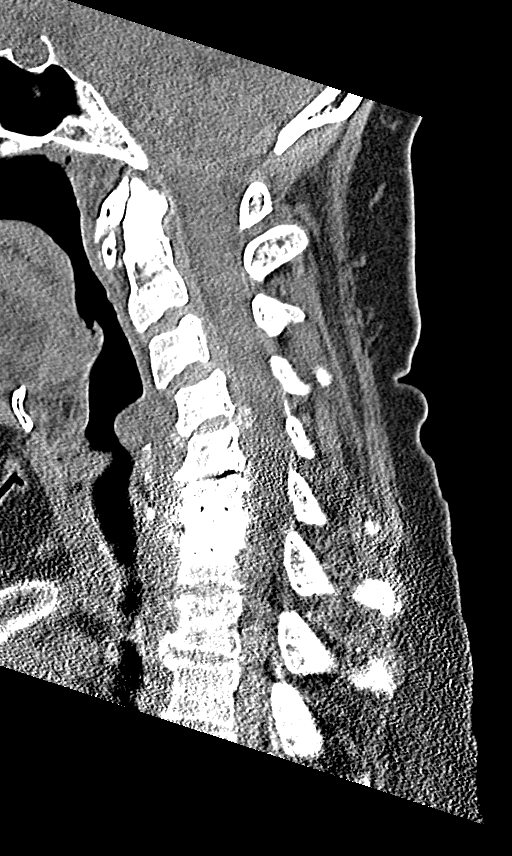
[im 25/49  bone]
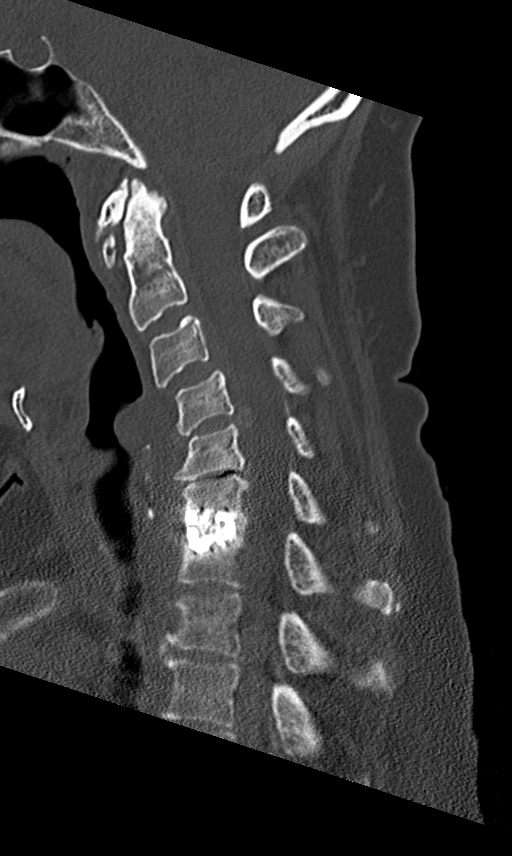
[im 29/49  bone]
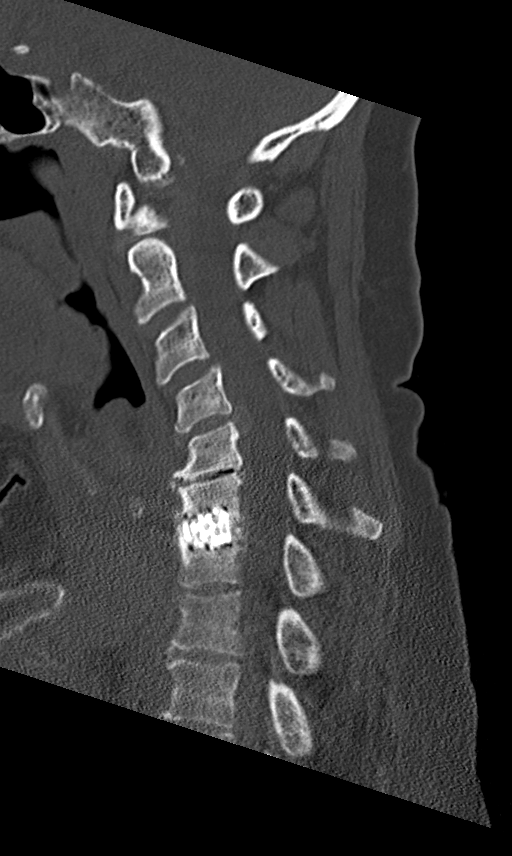
[im 33/49  bone]
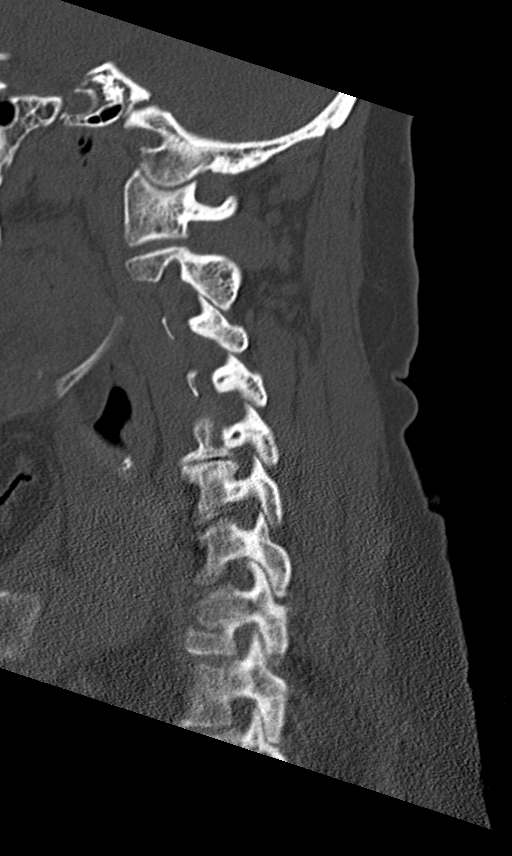

[Series 7: coronal bone · coronal · 0.23mm/px · 3 of 51 slices shown]
[im 11/51  bone]
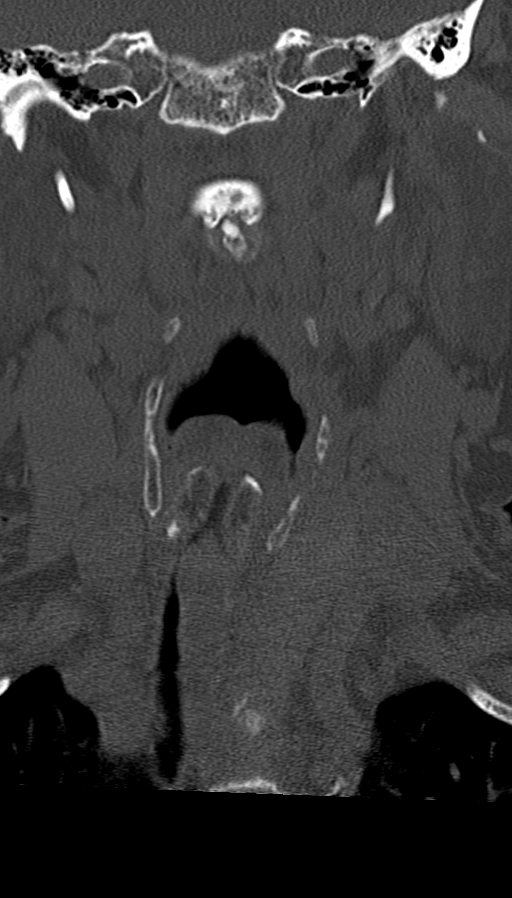
[im 21/51  bone]
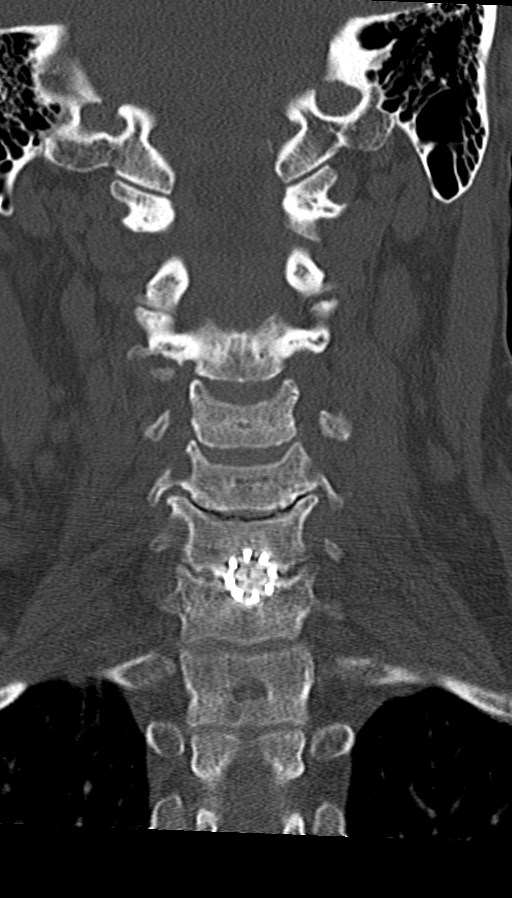
[im 31/51  bone]
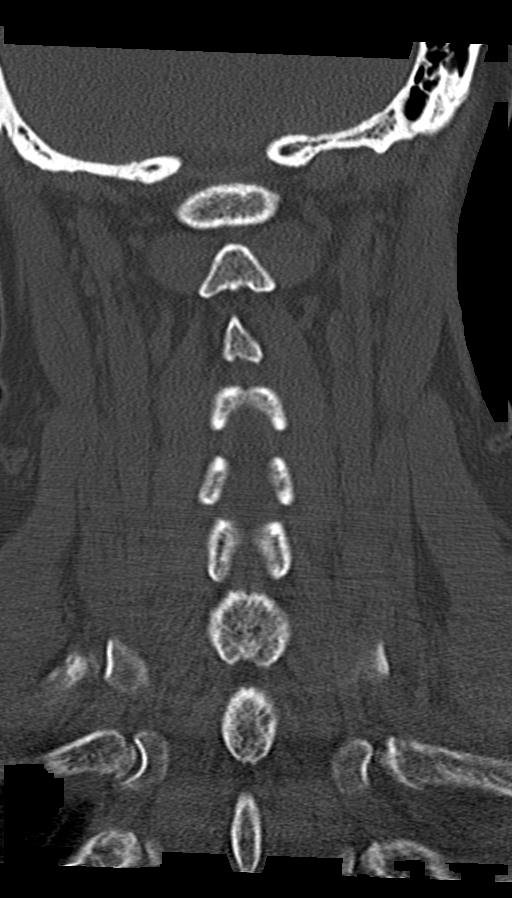

[Series 8: orthogonal bone · axial · 0.23mm/px · z∈[-76,+36]mm · 3 of 92 slices shown, 4 images]
[im 16/92  soft-tissue]
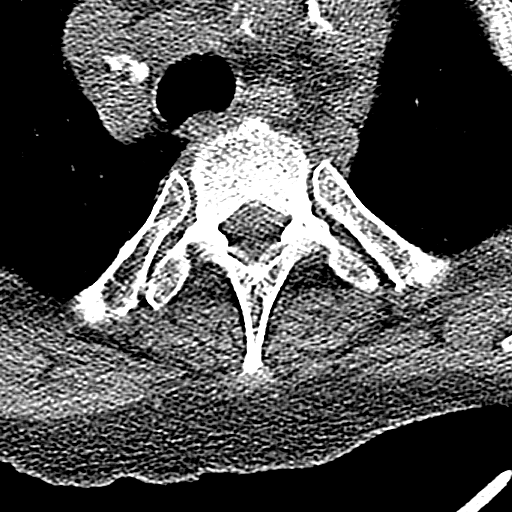
[im 16/92  bone]
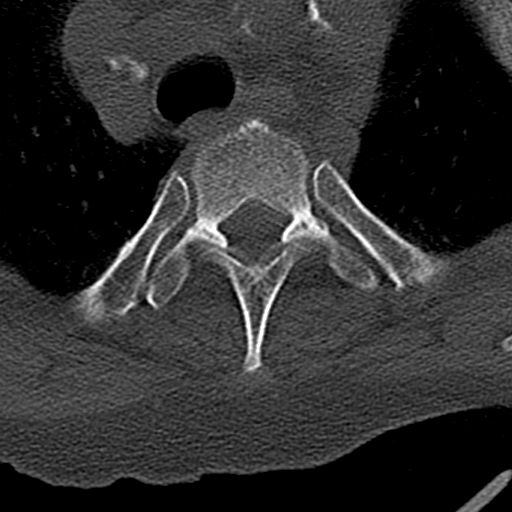
[im 46/92  bone]
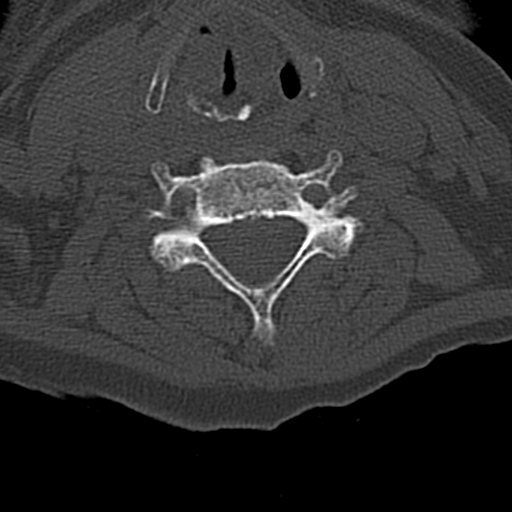
[im 76/92  bone]
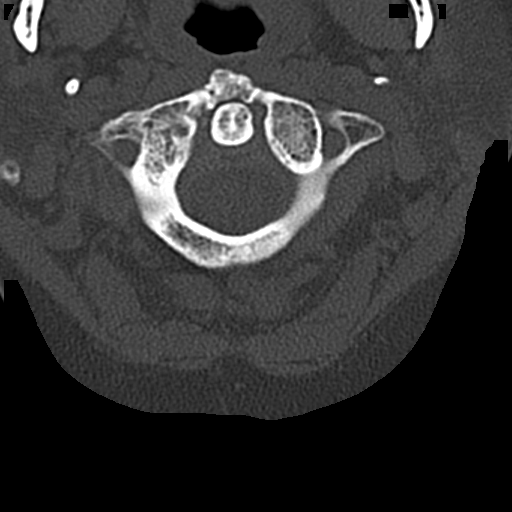

[11 of 33 positions shown; findings below may reference images not displayed]

FINDINGS: Alignment: Maintained. Kyphosis of the cervical spine appears
increased compared to the prior studies.

Skull base and vertebrae: No fracture is identified. Endplate
sclerosis at C5-6 noted. The patient is status post C6-7 fusion as
seen on the prior exams.

Soft tissues and spinal canal: No prevertebral fluid or swelling. No
visible canal hematoma.

Disc levels:  C2-3:  Negative.

C3-4: Left much worse than right facet degenerative disease. There
appears to be a central disc protrusion contacting the ventral cord.
Mild to moderate right foraminal narrowing is identified. The left
foramen is open.

C4-5: There is a disc bulge and right and left paracentral
protrusions. The right paracentral protrusion appears new since the
prior MRI. The protrusions appear to contact the cord. The foramina
are grossly patent.

C5-6: Disc bulge with endplate spur and uncovertebral disease are
seen. Disc appears to efface the ventral thecal sac. Marked
bilateral foraminal narrowing is identified.

C6-7: Status post discectomy and fusion. The central canal and
foramina appear patent.

C7-T1:  Negative.

Upper chest: Lung apices are clear.

Other: Atherosclerotic vascular disease is noted.
IMPRESSION: Please note that MRI is a better test for evaluation of degenerative
disc disease.

Likely new right paracentral protrusion at C4-5 where a left
paracentral protrusion is identified as on prior studies. The discs
appear to contact the cord.

Facet degenerative disease on the left at C3-4 appears worse
compared to the prior exams. Disc protrusion at C3-4 appears to
contact the cord, unchanged.

Disc effaces the ventral thecal sac at C5-6 where there is marked
bilateral foraminal narrowing. The appearance is unchanged.

## 2016-10-31 NOTE — Pre-Procedure Instructions (Signed)
    Lydia Hernandez  10/31/2016      [id number] - [place], [place] [address] Phone: [phone] Fax: [phone]  [facility] [place], [place] [address] [place] [address] Phone: [phone] Fax: [phone]    Your procedure is scheduled on Monday, April 23rd   Report to [facility] Admitting at 7:30 AM             (posted surgery time 9:30 - 12:00 ).  Call this number if you have problems or questions,   [phone], Mon - Fri from 8 -4:30 pm or [phone]   Remember:  Do not eat food or drink liquids after midnight Sunday.  Take these medicines the morning of surgery with A SIP OF WATER : Zyrtec, Flexeril, Gabapentin, Hydrocodone, Metoprolol, Pantoprazole.  May apply fentanyl patch if it is due.              4-5 days prior to surgery, STOP taking any Vitamins, Herbal Supplements, Anti-inflammatories.   Do not wear jewelry, make-up or nail polish.  Do not wear lotions, powders, perfumes, or deoderant.  Do not shave 48 hours prior to surgery.     Do not bring valuables to the hospital.  [facility] is not responsible for any belongings or valuables.  Contacts, dentures or bridgework may not be worn into surgery.  Leave your suitcase in the car.  After surgery it may be brought to your room.  For patients admitted to the hospital, discharge time will be determined by your treatment team.  Please read over the following fact sheets that you were given. Pain Booklet, MRSA Information and Surgical Site Infection Prevention

## 2016-11-03 ENCOUNTER — Encounter (HOSPITAL_COMMUNITY)
Admission: RE | Admit: 2016-11-03 | Discharge: 2016-11-03 | Disposition: A | Discharge status: Home / Self Care | Payer: Medicare Other | Source: Ambulatory Visit | Attending: Neurosurgery | Admitting: Neurosurgery

## 2016-11-03 ENCOUNTER — Encounter (HOSPITAL_COMMUNITY): Payer: Self-pay

## 2016-11-03 DIAGNOSIS — Z01812 Encounter for preprocedural laboratory examination: Secondary | ICD-10-CM | POA: Insufficient documentation

## 2016-11-03 DIAGNOSIS — Z01810 Encounter for preprocedural cardiovascular examination: Secondary | ICD-10-CM | POA: Diagnosis not present

## 2016-11-03 LAB — BASIC METABOLIC PANEL
Anion gap: 11 (ref 5–15)
BUN: 9 mg/dL (ref 6–20)
CALCIUM: 9.6 mg/dL (ref 8.9–10.3)
CO2: 29 mmol/L (ref 22–32)
CREATININE: 0.84 mg/dL (ref 0.44–1.00)
Chloride: 101 mmol/L (ref 101–111)
GFR calc non Af Amer: >60 mL/min (ref >60)
GLUCOSE: 99 mg/dL (ref 65–99)
Potassium: 3.1 mmol/L — ABNORMAL LOW (ref 3.5–5.1)
Sodium: 141 mmol/L (ref 135–145)

## 2016-11-03 LAB — CBC WITH DIFFERENTIAL/PLATELET
BASOS PCT: 1 %
Basophils Absolute: 0 10*3/uL (ref 0.0–0.1)
EOS ABS: 0.1 10*3/uL (ref 0.0–0.7)
Eosinophils Relative: 2 %
HCT: 42.6 % (ref 36.0–46.0)
HEMOGLOBIN: 14.7 g/dL (ref 12.0–15.0)
Lymphocytes Relative: 55 %
Lymphs Abs: 2.8 10*3/uL (ref 0.7–4.0)
MCH: 31.9 pg (ref 26.0–34.0)
MCHC: 34.5 g/dL (ref 30.0–36.0)
MCV: 92.4 fL (ref 78.0–100.0)
MONO ABS: 0.2 10*3/uL (ref 0.1–1.0)
MONOS PCT: 3 %
Neutro Abs: 2 10*3/uL (ref 1.7–7.7)
Neutrophils Relative %: 39 %
PLATELETS: 256 10*3/uL (ref 150–400)
RBC: 4.61 MIL/uL (ref 3.87–5.11)
RDW: 13.3 % (ref 11.5–15.5)
WBC: 5 10*3/uL (ref 4.0–10.5)

## 2016-11-03 LAB — SURGICAL PCR SCREEN
MRSA, PCR: NEGATIVE
Staphylococcus aureus: NEGATIVE

## 2016-11-03 NOTE — Progress Notes (Signed)
PCP is [place] 07/2016 She denies any heart problems or conditions.  Has never had any cardiac tests done. Denies SOB, diabetes.   She did say, that one time with anesthesia, she did get sick, but unsure of which one.  No problems lately.

## 2016-11-10 ENCOUNTER — Encounter (HOSPITAL_COMMUNITY): Payer: Self-pay | Admitting: Anesthesiology

## 2016-11-10 ENCOUNTER — Inpatient Hospital Stay (HOSPITAL_COMMUNITY)
Admission: RE | Admit: 2016-11-10 | Discharge: 2016-11-11 | DRG: 472 | Disposition: A | Discharge status: Home / Self Care | Payer: Medicare Other | Source: Ambulatory Visit | Attending: Neurosurgery | Admitting: Neurosurgery

## 2016-11-10 ENCOUNTER — Encounter (HOSPITAL_COMMUNITY)
Admission: RE | Disposition: A | Discharge status: Home / Self Care | Payer: Self-pay | Source: Ambulatory Visit | Attending: Neurosurgery

## 2016-11-10 ENCOUNTER — Inpatient Hospital Stay (HOSPITAL_COMMUNITY): Payer: Medicare Other | Admitting: Emergency Medicine

## 2016-11-10 ENCOUNTER — Inpatient Hospital Stay (HOSPITAL_COMMUNITY): Payer: Medicare Other | Admitting: Anesthesiology

## 2016-11-10 ENCOUNTER — Inpatient Hospital Stay (HOSPITAL_COMMUNITY): Payer: Medicare Other

## 2016-11-10 DIAGNOSIS — F17210 Nicotine dependence, cigarettes, uncomplicated: Secondary | ICD-10-CM | POA: Diagnosis present

## 2016-11-10 DIAGNOSIS — M4802 Spinal stenosis, cervical region: Principal | ICD-10-CM | POA: Diagnosis present

## 2016-11-10 DIAGNOSIS — M797 Fibromyalgia: Secondary | ICD-10-CM | POA: Diagnosis present

## 2016-11-10 DIAGNOSIS — Z888 Allergy status to other drugs, medicaments and biological substances status: Secondary | ICD-10-CM | POA: Diagnosis not present

## 2016-11-10 DIAGNOSIS — E039 Hypothyroidism, unspecified: Secondary | ICD-10-CM | POA: Diagnosis present

## 2016-11-10 DIAGNOSIS — F329 Major depressive disorder, single episode, unspecified: Secondary | ICD-10-CM | POA: Diagnosis present

## 2016-11-10 DIAGNOSIS — K589 Irritable bowel syndrome without diarrhea: Secondary | ICD-10-CM | POA: Diagnosis present

## 2016-11-10 DIAGNOSIS — Z79899 Other long term (current) drug therapy: Secondary | ICD-10-CM | POA: Diagnosis not present

## 2016-11-10 DIAGNOSIS — M2578 Osteophyte, vertebrae: Secondary | ICD-10-CM | POA: Diagnosis present

## 2016-11-10 DIAGNOSIS — M542 Cervicalgia: Secondary | ICD-10-CM | POA: Diagnosis present

## 2016-11-10 DIAGNOSIS — H269 Unspecified cataract: Secondary | ICD-10-CM | POA: Diagnosis present

## 2016-11-10 DIAGNOSIS — Z9889 Other specified postprocedural states: Secondary | ICD-10-CM | POA: Diagnosis not present

## 2016-11-10 DIAGNOSIS — Z8719 Personal history of other diseases of the digestive system: Secondary | ICD-10-CM

## 2016-11-10 DIAGNOSIS — Z981 Arthrodesis status: Secondary | ICD-10-CM

## 2016-11-10 DIAGNOSIS — M5001 Cervical disc disorder with myelopathy,  high cervical region: Secondary | ICD-10-CM | POA: Diagnosis present

## 2016-11-10 DIAGNOSIS — I10 Essential (primary) hypertension: Secondary | ICD-10-CM | POA: Diagnosis present

## 2016-11-10 DIAGNOSIS — Z419 Encounter for procedure for purposes other than remedying health state, unspecified: Secondary | ICD-10-CM

## 2016-11-10 DIAGNOSIS — Z9049 Acquired absence of other specified parts of digestive tract: Secondary | ICD-10-CM | POA: Diagnosis not present

## 2016-11-10 DIAGNOSIS — F419 Anxiety disorder, unspecified: Secondary | ICD-10-CM | POA: Diagnosis present

## 2016-11-10 DIAGNOSIS — K219 Gastro-esophageal reflux disease without esophagitis: Secondary | ICD-10-CM | POA: Diagnosis present

## 2016-11-10 DIAGNOSIS — Z79890 Hormone replacement therapy (postmenopausal): Secondary | ICD-10-CM

## 2016-11-10 DIAGNOSIS — Z90710 Acquired absence of both cervix and uterus: Secondary | ICD-10-CM | POA: Diagnosis not present

## 2016-11-10 DIAGNOSIS — M1990 Unspecified osteoarthritis, unspecified site: Secondary | ICD-10-CM | POA: Diagnosis present

## 2016-11-10 DIAGNOSIS — Z79891 Long term (current) use of opiate analgesic: Secondary | ICD-10-CM | POA: Diagnosis not present

## 2016-11-10 DIAGNOSIS — Z791 Long term (current) use of non-steroidal anti-inflammatories (NSAID): Secondary | ICD-10-CM | POA: Diagnosis not present

## 2016-11-10 DIAGNOSIS — Z809 Family history of malignant neoplasm, unspecified: Secondary | ICD-10-CM

## 2016-11-10 DIAGNOSIS — G9520 Unspecified cord compression: Secondary | ICD-10-CM | POA: Diagnosis present

## 2016-11-10 HISTORY — PX: ANTERIOR CERVICAL DECOMP/DISCECTOMY FUSION: SHX1161

## 2016-11-10 HISTORY — DX: Spinal stenosis, cervical region: M48.02

## 2016-11-10 SURGERY — ANTERIOR CERVICAL DECOMPRESSION/DISCECTOMY FUSION 3 LEVELS
Anesthesia: General

## 2016-11-10 MED ORDER — LIDOCAINE 2% (20 MG/ML) 5 ML SYRINGE
INTRAMUSCULAR | Status: AC
  Filled 2016-11-10: qty 5

## 2016-11-10 MED ORDER — HYDROCHLOROTHIAZIDE 12.5 MG PO CAPS
12.5000 mg | ORAL_CAPSULE | Freq: Every day | ORAL | Status: DC
  Administered 2016-11-11: 12.5 mg via ORAL
  Filled 2016-11-10 (×2): qty 1

## 2016-11-10 MED ORDER — LORATADINE 10 MG PO TABS
10.0000 mg | ORAL_TABLET | Freq: Every day | ORAL | Status: DC
  Administered 2016-11-11: 10 mg via ORAL
  Filled 2016-11-10: qty 1

## 2016-11-10 MED ORDER — FENTANYL CITRATE (PF) 250 MCG/5ML IJ SOLN
INTRAMUSCULAR | Status: AC
  Filled 2016-11-10: qty 5

## 2016-11-10 MED ORDER — CEFAZOLIN SODIUM-DEXTROSE 2-4 GM/100ML-% IV SOLN
2.0000 g | INTRAVENOUS | Status: AC
  Administered 2016-11-10: 2 g via INTRAVENOUS

## 2016-11-10 MED ORDER — KETAMINE HCL 10 MG/ML IJ SOLN
INTRAMUSCULAR | Status: DC | PRN
  Administered 2016-11-10 (×2): 40 mg via INTRAVENOUS

## 2016-11-10 MED ORDER — LACTATED RINGERS IV SOLN
INTRAVENOUS | Status: DC
  Administered 2016-11-10 (×2): via INTRAVENOUS

## 2016-11-10 MED ORDER — SUCCINYLCHOLINE CHLORIDE 200 MG/10ML IV SOSY
PREFILLED_SYRINGE | INTRAVENOUS | Status: AC
  Filled 2016-11-10: qty 10

## 2016-11-10 MED ORDER — ONDANSETRON HCL 4 MG PO TABS: 4.0000 mg | ORAL_TABLET | Freq: Four times a day (QID) | ORAL | Status: DC | PRN

## 2016-11-10 MED ORDER — CEFAZOLIN SODIUM-DEXTROSE 2-4 GM/100ML-% IV SOLN
INTRAVENOUS | Status: AC
  Filled 2016-11-10: qty 100

## 2016-11-10 MED ORDER — HYDROMORPHONE HCL 1 MG/ML IJ SOLN
0.2500 mg | INTRAMUSCULAR | Status: DC | PRN
  Administered 2016-11-10 (×3): 0.5 mg via INTRAVENOUS

## 2016-11-10 MED ORDER — ACETAMINOPHEN 500 MG PO TABS
ORAL_TABLET | ORAL | Status: AC
  Administered 2016-11-10: 1000 mg via ORAL
  Filled 2016-11-10: qty 2

## 2016-11-10 MED ORDER — METOPROLOL SUCCINATE ER 25 MG PO TB24
25.0000 mg | ORAL_TABLET | Freq: Every day | ORAL | Status: DC
Start: 2016-11-11 — End: 2016-11-11
  Administered 2016-11-11: 25 mg via ORAL
  Filled 2016-11-10: qty 1

## 2016-11-10 MED ORDER — SODIUM CHLORIDE 0.9% FLUSH: 3.0000 mL | INTRAVENOUS | Status: DC | PRN

## 2016-11-10 MED ORDER — SODIUM CHLORIDE 0.9 % IV SOLN: 250.0000 mL | INTRAVENOUS | Status: DC

## 2016-11-10 MED ORDER — SODIUM CHLORIDE 0.9 % IR SOLN
Status: DC | PRN
  Administered 2016-11-10: 09:00:00

## 2016-11-10 MED ORDER — ESMOLOL HCL 100 MG/10ML IV SOLN
INTRAVENOUS | Status: DC | PRN
  Administered 2016-11-10: 20 mg via INTRAVENOUS

## 2016-11-10 MED ORDER — POTASSIUM CHLORIDE ER 8 MEQ PO TBCR
8.0000 meq | EXTENDED_RELEASE_TABLET | Freq: Every day | ORAL | Status: DC
  Administered 2016-11-11: 8 meq via ORAL
  Filled 2016-11-10: qty 1

## 2016-11-10 MED ORDER — CYCLOBENZAPRINE HCL 10 MG PO TABS
ORAL_TABLET | ORAL | Status: AC
  Filled 2016-11-10: qty 1

## 2016-11-10 MED ORDER — FENTANYL CITRATE (PF) 100 MCG/2ML IJ SOLN
INTRAMUSCULAR | Status: DC | PRN
  Administered 2016-11-10: 50 ug via INTRAVENOUS
  Administered 2016-11-10: 100 ug via INTRAVENOUS
  Administered 2016-11-10: 50 ug via INTRAVENOUS
  Administered 2016-11-10 (×3): 100 ug via INTRAVENOUS

## 2016-11-10 MED ORDER — HYDROCODONE-ACETAMINOPHEN 5-325 MG PO TABS
ORAL_TABLET | ORAL | Status: AC
  Filled 2016-11-10: qty 2

## 2016-11-10 MED ORDER — PANTOPRAZOLE SODIUM 20 MG PO TBEC: 20.0000 mg | DELAYED_RELEASE_TABLET | Freq: Every day | ORAL | Status: DC | PRN

## 2016-11-10 MED ORDER — HYDROMORPHONE HCL 1 MG/ML IJ SOLN
0.5000 mg | INTRAMUSCULAR | Status: DC | PRN
  Administered 2016-11-10: 1 mg via INTRAVENOUS
  Filled 2016-11-10: qty 1

## 2016-11-10 MED ORDER — HYDROCODONE-ACETAMINOPHEN 5-325 MG PO TABS
1.0000 | ORAL_TABLET | ORAL | Status: DC | PRN
  Administered 2016-11-10 – 2016-11-11 (×6): 2 via ORAL
  Filled 2016-11-10 (×5): qty 2

## 2016-11-10 MED ORDER — MIDAZOLAM HCL 2 MG/2ML IJ SOLN
INTRAMUSCULAR | Status: AC
  Filled 2016-11-10: qty 2

## 2016-11-10 MED ORDER — 0.9 % SODIUM CHLORIDE (POUR BTL) OPTIME
TOPICAL | Status: DC | PRN
  Administered 2016-11-10: 1000 mL

## 2016-11-10 MED ORDER — LIDOCAINE HCL (CARDIAC) 20 MG/ML IV SOLN
INTRAVENOUS | Status: DC | PRN
  Administered 2016-11-10: 60 mg via INTRAVENOUS

## 2016-11-10 MED ORDER — MENTHOL 3 MG MT LOZG: 1.0000 | LOZENGE | OROMUCOSAL | Status: DC | PRN

## 2016-11-10 MED ORDER — ONDANSETRON HCL 4 MG/2ML IJ SOLN
INTRAMUSCULAR | Status: AC
  Filled 2016-11-10: qty 2

## 2016-11-10 MED ORDER — PROPOFOL 10 MG/ML IV BOLUS
INTRAVENOUS | Status: AC
  Filled 2016-11-10: qty 20

## 2016-11-10 MED ORDER — DEXAMETHASONE SODIUM PHOSPHATE 10 MG/ML IJ SOLN
10.0000 mg | INTRAMUSCULAR | Status: AC
  Administered 2016-11-10: 10 mg via INTRAVENOUS
  Filled 2016-11-10: qty 1

## 2016-11-10 MED ORDER — CYCLOBENZAPRINE HCL 10 MG PO TABS
10.0000 mg | ORAL_TABLET | Freq: Three times a day (TID) | ORAL | Status: DC | PRN
  Administered 2016-11-10 – 2016-11-11 (×3): 10 mg via ORAL
  Filled 2016-11-10 (×2): qty 1

## 2016-11-10 MED ORDER — MIDAZOLAM HCL 5 MG/5ML IJ SOLN
INTRAMUSCULAR | Status: DC | PRN
  Administered 2016-11-10: 2 mg via INTRAVENOUS

## 2016-11-10 MED ORDER — KETAMINE HCL-SODIUM CHLORIDE 100-0.9 MG/10ML-% IV SOSY
PREFILLED_SYRINGE | INTRAVENOUS | Status: AC
  Filled 2016-11-10: qty 10

## 2016-11-10 MED ORDER — ONDANSETRON HCL 4 MG/2ML IJ SOLN
4.0000 mg | Freq: Four times a day (QID) | INTRAMUSCULAR | Status: DC | PRN
  Administered 2016-11-10: 4 mg via INTRAVENOUS
  Filled 2016-11-10: qty 2

## 2016-11-10 MED ORDER — SUCCINYLCHOLINE CHLORIDE 20 MG/ML IJ SOLN
INTRAMUSCULAR | Status: DC | PRN
  Administered 2016-11-10: 100 mg via INTRAVENOUS

## 2016-11-10 MED ORDER — SODIUM CHLORIDE 0.9% FLUSH
3.0000 mL | Freq: Two times a day (BID) | INTRAVENOUS | Status: DC
Start: 2016-11-10 — End: 2016-11-11
  Administered 2016-11-10: 3 mL via INTRAVENOUS

## 2016-11-10 MED ORDER — ESMOLOL HCL 100 MG/10ML IV SOLN
INTRAVENOUS | Status: AC
  Filled 2016-11-10: qty 10

## 2016-11-10 MED ORDER — FUROSEMIDE 20 MG PO TABS: 20.0000 mg | ORAL_TABLET | Freq: Every day | ORAL | Status: DC | PRN

## 2016-11-10 MED ORDER — ACETAMINOPHEN 500 MG PO TABS
1000.0000 mg | ORAL_TABLET | Freq: Once | ORAL | Status: AC
  Administered 2016-11-10: 1000 mg via ORAL

## 2016-11-10 MED ORDER — PHENOL 1.4 % MT LIQD
1.0000 | OROMUCOSAL | Status: DC | PRN
  Filled 2016-11-10: qty 177

## 2016-11-10 MED ORDER — HYDROMORPHONE HCL 1 MG/ML IJ SOLN
INTRAMUSCULAR | Status: AC
  Administered 2016-11-10: 0.5 mg via INTRAVENOUS
  Filled 2016-11-10: qty 0.5

## 2016-11-10 MED ORDER — MEDROXYPROGESTERONE ACETATE 2.5 MG PO TABS
5.0000 mg | ORAL_TABLET | Freq: Every day | ORAL | Status: DC
  Administered 2016-11-11: 5 mg via ORAL
  Filled 2016-11-10: qty 1

## 2016-11-10 MED ORDER — ROCURONIUM BROMIDE 100 MG/10ML IV SOLN
INTRAVENOUS | Status: DC | PRN
  Administered 2016-11-10: 50 mg via INTRAVENOUS

## 2016-11-10 MED ORDER — THROMBIN 20000 UNITS EX SOLR
CUTANEOUS | Status: AC
  Filled 2016-11-10: qty 20000

## 2016-11-10 MED ORDER — PHENYLEPHRINE 40 MCG/ML (10ML) SYRINGE FOR IV PUSH (FOR BLOOD PRESSURE SUPPORT)
PREFILLED_SYRINGE | INTRAVENOUS | Status: AC
  Filled 2016-11-10: qty 10

## 2016-11-10 MED ORDER — CEFAZOLIN SODIUM-DEXTROSE 1-4 GM/50ML-% IV SOLN
1.0000 g | Freq: Three times a day (TID) | INTRAVENOUS | Status: AC
  Administered 2016-11-10 – 2016-11-11 (×2): 1 g via INTRAVENOUS
  Filled 2016-11-10 (×2): qty 50

## 2016-11-10 MED ORDER — MELOXICAM 7.5 MG PO TABS
15.0000 mg | ORAL_TABLET | Freq: Every day | ORAL | Status: DC
Start: 2016-11-10 — End: 2016-11-11
  Administered 2016-11-11: 15 mg via ORAL
  Filled 2016-11-10 (×2): qty 1

## 2016-11-10 MED ORDER — HYDROMORPHONE HCL 1 MG/ML IJ SOLN
INTRAMUSCULAR | Status: AC
  Filled 2016-11-10: qty 0.5

## 2016-11-10 MED ORDER — FENTANYL 25 MCG/HR TD PT72: 25.0000 ug | MEDICATED_PATCH | TRANSDERMAL | Status: DC

## 2016-11-10 MED ORDER — TRAVOPROST (BAK FREE) 0.004 % OP SOLN: 1.0000 [drp] | Freq: Every evening | OPHTHALMIC | Status: DC | PRN

## 2016-11-10 MED ORDER — THROMBIN 20000 UNITS EX SOLR
CUTANEOUS | Status: DC | PRN
  Administered 2016-11-10: 09:00:00 via TOPICAL

## 2016-11-10 MED ORDER — SUGAMMADEX SODIUM 200 MG/2ML IV SOLN
INTRAVENOUS | Status: AC
  Filled 2016-11-10: qty 2

## 2016-11-10 MED ORDER — GABAPENTIN 300 MG PO CAPS
300.0000 mg | ORAL_CAPSULE | Freq: Four times a day (QID) | ORAL | Status: DC
  Administered 2016-11-10 – 2016-11-11 (×3): 300 mg via ORAL
  Filled 2016-11-10 (×3): qty 1

## 2016-11-10 MED ORDER — ONDANSETRON HCL 4 MG/2ML IJ SOLN
INTRAMUSCULAR | Status: DC | PRN
  Administered 2016-11-10: 4 mg via INTRAVENOUS

## 2016-11-10 MED ORDER — PROPOFOL 10 MG/ML IV BOLUS
INTRAVENOUS | Status: DC | PRN
  Administered 2016-11-10: 100 mg via INTRAVENOUS

## 2016-11-10 MED ORDER — THROMBIN 5000 UNITS EX SOLR
OROMUCOSAL | Status: DC | PRN
  Administered 2016-11-10: 11:00:00 via TOPICAL

## 2016-11-10 MED ORDER — THROMBIN 5000 UNITS EX SOLR
CUTANEOUS | Status: AC
  Filled 2016-11-10: qty 5000

## 2016-11-10 MED ORDER — SUGAMMADEX SODIUM 200 MG/2ML IV SOLN
INTRAVENOUS | Status: DC | PRN
  Administered 2016-11-10: 200 mg via INTRAVENOUS

## 2016-11-10 SURGICAL SUPPLY — 65 items
APL SKNCLS STERI-STRIP NONHPOA (GAUZE/BANDAGES/DRESSINGS) ×1
BAG DECANTER FOR FLEXI CONT (MISCELLANEOUS) ×3 IMPLANT
BENZOIN TINCTURE PRP APPL 2/3 (GAUZE/BANDAGES/DRESSINGS) ×3 IMPLANT
BIT DRILL 13 (BIT) ×1 IMPLANT
BIT DRILL 13MM (BIT) ×1
BUR MATCHSTICK NEURO 3.0 LAGG (BURR) ×3 IMPLANT
CAGE PEEK 6X14X11 (Cage) ×9 IMPLANT
CAGE SPNL 11X14X6XRADOPQ (Cage) IMPLANT
CANISTER SUCT 3000ML PPV (MISCELLANEOUS) ×3 IMPLANT
CARTRIDGE OIL MAESTRO DRILL (MISCELLANEOUS) ×1 IMPLANT
CLOSURE WOUND 1/2 X4 (GAUZE/BANDAGES/DRESSINGS) ×1
DIFFUSER DRILL AIR PNEUMATIC (MISCELLANEOUS) ×3 IMPLANT
DRAPE C-ARM 42X72 X-RAY (DRAPES) ×6 IMPLANT
DRAPE LAPAROTOMY 100X72 PEDS (DRAPES) ×3 IMPLANT
DRAPE MICROSCOPE LEICA (MISCELLANEOUS) ×3 IMPLANT
DRAPE POUCH INSTRU U-SHP 10X18 (DRAPES) ×3 IMPLANT
DURAPREP 6ML APPLICATOR 50/CS (WOUND CARE) ×3 IMPLANT
ELECT COATED BLADE 2.86 ST (ELECTRODE) ×3 IMPLANT
ELECT REM PT RETURN 9FT ADLT (ELECTROSURGICAL) ×3
ELECTRODE REM PT RTRN 9FT ADLT (ELECTROSURGICAL) ×1 IMPLANT
GAUZE SPONGE 4X4 12PLY STRL (GAUZE/BANDAGES/DRESSINGS) ×3 IMPLANT
GAUZE SPONGE 4X4 16PLY XRAY LF (GAUZE/BANDAGES/DRESSINGS) IMPLANT
GLOVE BIO SURGEON STRL SZ8 (GLOVE) ×2 IMPLANT
GLOVE BIO SURGEON STRL SZ8.5 (GLOVE) ×2 IMPLANT
GLOVE ECLIPSE 9.0 STRL (GLOVE) ×3 IMPLANT
GLOVE EXAM NITRILE LRG STRL (GLOVE) IMPLANT
GLOVE EXAM NITRILE XL STR (GLOVE) IMPLANT
GLOVE EXAM NITRILE XS STR PU (GLOVE) IMPLANT
GLOVE INDICATOR 7.5 STRL GRN (GLOVE) ×4 IMPLANT
GLOVE INDICATOR 8.0 STRL GRN (GLOVE) ×2 IMPLANT
GLOVE SURG SS PI 7.0 STRL IVOR (GLOVE) ×6 IMPLANT
GOWN STRL REUS W/ TWL LRG LVL3 (GOWN DISPOSABLE) IMPLANT
GOWN STRL REUS W/ TWL XL LVL3 (GOWN DISPOSABLE) IMPLANT
GOWN STRL REUS W/TWL 2XL LVL3 (GOWN DISPOSABLE) IMPLANT
GOWN STRL REUS W/TWL LRG LVL3 (GOWN DISPOSABLE)
GOWN STRL REUS W/TWL XL LVL3 (GOWN DISPOSABLE) ×9
HALTER HD/CHIN CERV TRACTION D (MISCELLANEOUS) ×3 IMPLANT
HEMOSTAT POWDER SURGIFOAM 1G (HEMOSTASIS) ×2 IMPLANT
KIT BASIN OR (CUSTOM PROCEDURE TRAY) ×3 IMPLANT
KIT ROOM TURNOVER OR (KITS) ×3 IMPLANT
NDL SPNL 20GX3.5 QUINCKE YW (NEEDLE) ×1 IMPLANT
NEEDLE SPNL 20GX3.5 QUINCKE YW (NEEDLE) ×3 IMPLANT
NS IRRIG 1000ML POUR BTL (IV SOLUTION) ×3 IMPLANT
OIL CARTRIDGE MAESTRO DRILL (MISCELLANEOUS) ×3
PACK LAMINECTOMY NEURO (CUSTOM PROCEDURE TRAY) ×3 IMPLANT
PAD ARMBOARD 7.5X6 YLW CONV (MISCELLANEOUS) ×9 IMPLANT
PLATE 3 57.5XLCK NS SPNE CVD (Plate) IMPLANT
PLATE 3 ATLANTIS TRANS (Plate) ×3 IMPLANT
RUBBERBAND STERILE (MISCELLANEOUS) ×6 IMPLANT
SCREW ST 13X4XST VA NS SPNE (Screw) IMPLANT
SCREW ST FIX 4 ATL 3120213 (Screw) ×14 IMPLANT
SCREW ST VAR 4 ATL (Screw) ×6 IMPLANT
SPACER SPNL 11X14X6XPEEK CVD (Cage) IMPLANT
SPCR SPNL 11X14X6XPEEK CVD (Cage) ×2 IMPLANT
SPONGE INTESTINAL PEANUT (DISPOSABLE) ×3 IMPLANT
SPONGE SURGIFOAM ABS GEL 100 (HEMOSTASIS) ×3 IMPLANT
STRIP CLOSURE SKIN 1/2X4 (GAUZE/BANDAGES/DRESSINGS) ×2 IMPLANT
SUT VIC AB 3-0 SH 8-18 (SUTURE) ×3 IMPLANT
SUT VIC AB 4-0 RB1 18 (SUTURE) ×3 IMPLANT
TAPE CLOTH 4X10 WHT NS (GAUZE/BANDAGES/DRESSINGS) ×3 IMPLANT
TAPE CLOTH SURG 4X10 WHT LF (GAUZE/BANDAGES/DRESSINGS) ×2 IMPLANT
TOWEL GREEN STERILE (TOWEL DISPOSABLE) ×3 IMPLANT
TOWEL GREEN STERILE FF (TOWEL DISPOSABLE) ×2 IMPLANT
TRAP SPECIMEN MUCOUS 40CC (MISCELLANEOUS) ×3 IMPLANT
WATER STERILE IRR 1000ML POUR (IV SOLUTION) ×3 IMPLANT

## 2016-11-10 NOTE — H&P (Signed)
Lydia Hernandez is an 61 y.o. female.   Chief Complaint: Neck pain HPI: 61 year old female with progressive neck and bilateral upper extremity symptoms right greater than left. Workup demonstrates evidence of progressive cervical stenosis with marked spinal cord compression particular D&C 45 but also significantly at C3-4 and C5-6. Patient status post previous anterior cervical fusion at C6-7. Patient presents now for three-level anterior cervical decompression and fusion in hopes of improving her symptoms.  Past Medical History:  Diagnosis Date  . Anxiety   . Chronic back pain   . Depression   . Fibromyalgia   . GERD (gastroesophageal reflux disease)   . Hiatal hernia   . History of tobacco abuse 05/23/2015  . Hypertension   . Hypothyroidism   . IBS (irritable bowel syndrome)   . Musculoskeletal pain 05/23/2015  . Neuropathic pain 05/23/2015  . Osteoarthritis     Past Surgical History:  Procedure Laterality Date  . ABDOMINAL HYSTERECTOMY    . ANKLE SURGERY Right   . BACK SURGERY    . CHOLECYSTECTOMY    . EYE SURGERY     bilateral cataracts  . HERNIA REPAIR    . NECK SURGERY      Family History  Problem Relation Age of Onset  . Cancer Mother   . Cancer Father    Social History:  reports that she has been smoking.  She has been smoking about 0.20 packs per day. She has never used smokeless tobacco. She reports that she does not drink alcohol or use drugs.  Allergies:  Allergies  Allergen Reactions  . Prednisone Nausea And Vomiting    Medications Prior to Admission  Medication Sig Dispense Refill  . cetirizine (ZYRTEC) 10 MG tablet Take 10 mg by mouth daily.    . cyclobenzaprine (FLEXERIL) 10 MG tablet Take 1 tablet (10 mg total) by mouth 3 (three) times daily as needed for muscle spasms. 90 tablet 2  . fentaNYL (DURAGESIC - DOSED MCG/HR) 25 MCG/HR patch Place 25 mcg onto the skin every 3 (three) days.    . furosemide (LASIX) 20 MG tablet Take 20 mg by mouth daily as needed  (for swelling).     . gabapentin (NEURONTIN) 300 MG capsule Take 1 capsule (300 mg total) by mouth 4 (four) times daily. (Patient taking differently: Take 300 mg by mouth 3 (three) times daily as needed (for neuropathy pain). ) 120 capsule 2  . hydrochlorothiazide (MICROZIDE) 12.5 MG capsule Take 12.5 mg by mouth daily. Reported on 08/06/2015    . HYDROcodone-acetaminophen (NORCO) 10-325 MG tablet Take 1 tablet by mouth every 8 (eight) hours as needed (for pain.).    Marland Kitchen medroxyPROGESTERone (PROVERA) 5 MG tablet Take 5 mg by mouth daily.     . meloxicam (MOBIC) 15 MG tablet Take 1 tablet (15 mg total) by mouth daily. 30 tablet 2  . metoprolol succinate (TOPROL-XL) 25 MG 24 hr tablet Take 25 mg by mouth daily.     . pantoprazole (PROTONIX) 20 MG tablet Take 20 mg by mouth daily as needed (for heartburn, indigestion).     . potassium chloride (KLOR-CON) 8 MEQ tablet Take 8 mEq by mouth daily. For cramps.     . travoprost, benzalkonium, (TRAVATAN) 0.004 % ophthalmic solution Place 1 drop into both eyes at bedtime as needed (for eye pressure--patient preference).       No results found for this or any previous visit (from the past 48 hour(s)). No results found.  Pertinent items noted in HPI and remainder  of comprehensive ROS otherwise negative.  Blood pressure 117/76, pulse 79, temperature 98.2 F (36.8 C), temperature source Oral, resp. rate 20, weight 83.5 kg (184 lb), SpO2 100 %.  Patient is awake and alert. She is oriented and appropriate. Her cranial nerve function is intact. Her motor examination of her extremities real some mild weakness of her upper extremities right greater than left. There is no focal myotome loss however. Sensory examination also reveals some patchy distal sensory loss in both hands. Reflexes are increased. Hoffmann's responses are present in both hands. Gait is moderately spastic. Examination head ears eyes nose and throat unremarkable. Chest and abdomen are benign.  Extremities are free from injury deformity. 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 Plan C3-4, C4-5, C5-6 anterior cervical discectomy and fusion with interbody cages, locally harvested autograft, and anterior plate instrumentation. Risks and benefits of been explained. Patient wishes to proceed.  Consepcion Utt A 11/10/2016, 9:25 AM

## 2016-11-10 NOTE — Transfer of Care (Signed)
Immediate Anesthesia Transfer of Care Note  Patient: Lydia Hernandez  Procedure(s) Performed: Procedure(s): Anterior Cervical Decompression Fusion  - Cervical three-Cervical four - Cervical four-Cervical five - Cervical five-Cervical six (N/A)  Patient Location: PACU  Anesthesia Type:General  Level of Consciousness: sedated, patient cooperative and responds to stimulation  Airway & Oxygen Therapy: Patient Spontanous Breathing and Patient connected to nasal cannula oxygen  Post-op Assessment: Report given to RN, Post -op Vital signs reviewed and stable and Patient moving all extremities  Post vital signs: Reviewed and stable  Last Vitals:  Vitals:   11/10/16 0810  BP: 117/76  Pulse: 79  Resp: 20  Temp: 36.8 C    Last Pain:  Vitals:   11/10/16 0810  TempSrc: Oral  PainSc:          Complications: No apparent anesthesia complications

## 2016-11-10 NOTE — Anesthesia Procedure Notes (Signed)
Procedure Name: Intubation Date/Time: 11/10/2016 9:47 AM Performed by: Rebekah Chesterfield L Pre-anesthesia Checklist: Patient identified, Emergency Drugs available, Suction available and Patient being monitored Patient Re-evaluated:Patient Re-evaluated prior to inductionOxygen Delivery Method: Circle System Utilized Preoxygenation: Pre-oxygenation with 100% oxygen Intubation Type: IV induction Ventilation: Mask ventilation without difficulty Laryngoscope Size: Mac and 3 Grade View: Grade I Tube type: Oral Tube size: 7.5 mm Number of attempts: 1 Airway Equipment and Method: Stylet and Oral airway Placement Confirmation: ETT inserted through vocal cords under direct vision,  positive ETCO2 and breath sounds checked- equal and bilateral Secured at: 21 cm Tube secured with: Tape Dental Injury: Teeth and Oropharynx as per pre-operative assessment

## 2016-11-10 NOTE — Anesthesia Postprocedure Evaluation (Signed)
Anesthesia Post Note  Patient: Jacklynn Dehaas  Procedure(s) Performed: Procedure(s) (LRB): Anterior Cervical Decompression Fusion  - Cervical three-Cervical four - Cervical four-Cervical five - Cervical five-Cervical six (N/A)  Patient location during evaluation: PACU Anesthesia Type: General Level of consciousness: awake and sedated Pain management: pain level controlled Vital Signs Assessment: post-procedure vital signs reviewed and stable Respiratory status: spontaneous breathing, nonlabored ventilation, respiratory function stable and patient connected to nasal cannula oxygen Cardiovascular status: blood pressure returned to baseline and stable Postop Assessment: no signs of nausea or vomiting Anesthetic complications: no       Last Vitals:  Vitals:   11/10/16 1415 11/10/16 1418  BP:    Pulse: 95 95  Resp: 12 12  Temp: 36.4 C     Last Pain:  Vitals:   11/10/16 1400  TempSrc:   PainSc: Asleep                 Brightyn Mozer,JAMES TERRILL

## 2016-11-10 NOTE — Op Note (Signed)
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  Asst. Surgeon: Arnoldo Morale  Anesthesia: General  Indication: 61 year old female status post prior C6-7 anterior cervical fusion by another physician. Patient with progressive neck pain and bilateral upper extremity symptoms consistent with an early progressive cervical myelopathy. Workup demonstrates evidence of multilevel disc degeneration with central disc herniation and spinal cord compression. Patient presents now for three-level anterior cervical decompression infusion in hopes of improving her symptoms.  Operative note: After induction of anesthesia, patient position supine with neck slightly extended held place a pulse of halter traction. Patient's anterior circumflex region prepped and draped sterilely. Incision made overlying C4-5. Dissection performed on the right. Retractor placed. Fluoroscopy used. Levels confirmed. Discectomies then performed with various instruments down to level the posterior annulus. Microscope front field use at the remainder of the discectomy. Remaining aspects of annulus and osteophytes removed using high-speed drill down to level posterior longitudinal ligament. Posterior longitudinal ligament was elevated and resected in piecemeal fashion. All elements of the underlying disc herniation were resected. Underlying thecal sac was identified. Wide central decompression was then performed by undercutting the bodies of C3 and C4. Decompression then proceeded into each neural foramen. Wide anterior foraminotomies were performed ON course exiting C4 nerve roots bilaterally. At this point a  very thorough decompression been achieved. There is no evidence of injury to thecal sac and nerve roots. Procedure then repeated at C4-5 and C5-6. Wounds were then irrigated. [employer] anatomic peek cages packed with locally harvested autograft were then impacted in place at all 3 levels. Each cage was recessed slightly from the anterior cortical margin. 57 mm Atlantis translational plate was then placed over the C3-C6 level. This an attachment or fluoroscopic guidance using 13 mm fixed angle screws to reach it all 4 levels. All screws given a final tightening found to be solidly within the bone. Locking screws and gauge at all 4 levels. Final images revealed good position of the cages and the instrumentation at the proper upper level with normal lamina spine. Wounds and irrigated one final time. Hemostasis was assured with bipolar chart was and close in layers with Vicryl sutures. Steri-Strips and sterile dressing were applied. No apparent palpitations. Patient tolerated the procedure well and she returns to the recovery room postop

## 2016-11-10 NOTE — Progress Notes (Signed)
Orthopedic Tech Progress Note Patient Details:  Lydia Hernandez 05/06/1956 [id number]  Ortho Devices Type of Ortho Device: Soft collar Ortho Device/Splint Location: applied soft collar to pt neck.  pt tolerated well.  Floor nurse assisted with application.   Ortho Device/Splint Interventions: Application   Kristopher Oppenheim 11/10/2016, 1:15 PM

## 2016-11-10 NOTE — Anesthesia Preprocedure Evaluation (Addendum)
Anesthesia Evaluation  Patient identified by MRN, date of birth, ID band Patient awake    Reviewed: Allergy & Precautions, H&P , NPO status , Patient's Chart, lab work & pertinent test results, reviewed documented beta blocker date and time   Airway Mallampati: II  TM Distance: >3 FB Neck ROM: Full    Dental no notable dental hx. (+) Edentulous Upper, Partial Lower, Poor Dentition, Dental Advisory Given   Pulmonary Current Smoker,    Pulmonary exam normal breath sounds clear to auscultation       Cardiovascular hypertension, Pt. on medications and Pt. on home beta blockers  Rhythm:Regular Rate:Normal     Neuro/Psych Anxiety Depression negative neurological ROS     GI/Hepatic Neg liver ROS, GERD  Medicated and Controlled,  Endo/Other  Hypothyroidism   Renal/GU negative Renal ROS  negative genitourinary   Musculoskeletal  (+) Arthritis , Osteoarthritis,  Fibromyalgia -  Abdominal   Peds  Hematology negative hematology ROS (+)   Anesthesia Other Findings   Reproductive/Obstetrics negative OB ROS                            Anesthesia Physical Anesthesia Plan  ASA: III  Anesthesia Plan: General   Post-op Pain Management:    Induction: Intravenous  Airway Management Planned: Oral ETT  Additional Equipment:   Intra-op Plan:   Post-operative Plan: Extubation in OR  Informed Consent: I have reviewed the patients History and Physical, chart, labs and discussed the procedure including the risks, benefits and alternatives for the proposed anesthesia with the patient or authorized representative who has indicated his/her understanding and acceptance.   Dental advisory given  Plan Discussed with: CRNA  Anesthesia Plan Comments:         Anesthesia Quick Evaluation

## 2016-11-10 NOTE — Brief Op Note (Signed)
11/10/2016  11:48 AM  PATIENT:  Lydia Hernandez  61 y.o. female  PRE-OPERATIVE DIAGNOSIS:  Stenosis  POST-OPERATIVE DIAGNOSIS:  Stenosis  PROCEDURE:  Procedure(s): Anterior Cervical Decompression Fusion  - Cervical three-Cervical four - Cervical four-Cervical five - Cervical five-Cervical six (N/A)  SURGEON:  Surgeon(s) and Role:    * Earnie Larsson, MD - Primary    * Newman Pies, MD - Assisting  PHYSICIAN ASSISTANT:   ASSISTANTS:    ANESTHESIA:   general  EBL:  Total I/O In: 1100 [I.V.:1100] Out: 150 [Blood:150]  BLOOD ADMINISTERED:none  DRAINS: none   LOCAL MEDICATIONS USED:  NONE  SPECIMEN:  No Specimen  DISPOSITION OF SPECIMEN:  N/A  COUNTS:  YES  TOURNIQUET:  * No tourniquets in log *  DICTATION: .Dragon Dictation  PLAN OF CARE: Admit to inpatient   PATIENT DISPOSITION:  PACU - hemodynamically stable.   Delay start of Pharmacological VTE agent (>24hrs) due to surgical blood loss or risk of bleeding: yes

## 2016-11-11 ENCOUNTER — Encounter (HOSPITAL_COMMUNITY): Payer: Self-pay | Admitting: Neurosurgery

## 2016-11-11 MED ORDER — HYDROCODONE-ACETAMINOPHEN 10-325 MG PO TABS: 1.0000 | ORAL_TABLET | Freq: Three times a day (TID) | ORAL | 0 refills | Status: DC | PRN

## 2016-11-11 NOTE — Progress Notes (Signed)
Pt doing well. Pt and family given D/C instructions with Rx, verbal understanding was provided. Pt's IV was removed prior to D/C. Pt's incision is open to air with no sign of infection. Pt D/C'd home via wheelchair @ 1210 per MD order. Pt is stable @ D/C and has no other needs at this time. Holli Humbles, RN

## 2016-11-11 NOTE — Progress Notes (Signed)
Pt received cane from [facility] prior to D/C. Holli Humbles, RN

## 2016-11-11 NOTE — Discharge Summary (Signed)
Physician Discharge Summary  Patient ID: Lydia Hernandez MRN: [id number] DOB/AGE: 25-Oct-1955 61 y.o.  Admit date: 11/10/2016 Discharge date: 11/11/2016  Admission Diagnoses:  Discharge Diagnoses:  Active Problems:   Cervical stenosis of spinal canal   Discharged Condition: good  Hospital Course: Patient admitted to hospital where she underwent an uncomplicated three-level anterior cervical decompression infusion. Postoperative she is doing well. Preoperative neck and upper extremity symptoms much improved. Standing and ambulating without difficulty. Eating and swallowing well. Ready for discharge home.  Consults:   Significant Diagnostic Studies:   Treatments:   Discharge Exam: Blood pressure 117/76, pulse 78, temperature 98.4 F (36.9 C), temperature source Oral, resp. rate 20, weight 83.5 kg (184 lb), SpO2 99 %. Awake and alert. Oriented and appropriate. Cranial nerve function intact. Motor sensory function extremities normal. Wound clean and dry. Chest and abdomen benign.  Disposition: 01-Home or Self Care   Allergies as of 11/11/2016      Reactions   Prednisone Nausea And Vomiting      Medication List    TAKE these medications   cetirizine 10 MG tablet Commonly known as:  ZYRTEC Take 10 mg by mouth daily.   cyclobenzaprine 10 MG tablet Commonly known as:  FLEXERIL Take 1 tablet (10 mg total) by mouth 3 (three) times daily as needed for muscle spasms.   fentaNYL 25 MCG/HR patch Commonly known as:  DURAGESIC - dosed mcg/hr Place 25 mcg onto the skin every 3 (three) days.   gabapentin 300 MG capsule Commonly known as:  NEURONTIN Take 1 capsule (300 mg total) by mouth 4 (four) times daily. What changed:  when to take this  reasons to take this   hydrochlorothiazide 12.5 MG capsule Commonly known as:  MICROZIDE Take 12.5 mg by mouth daily. Reported on 08/06/2015   HYDROcodone-acetaminophen 10-325 MG tablet Commonly known as:  NORCO Take 1-2 tablets by mouth  every 8 (eight) hours as needed (for pain.). What changed:  how much to take   LASIX 20 MG tablet Generic drug:  furosemide Take 20 mg by mouth daily as needed (for swelling).   medroxyPROGESTERone 5 MG tablet Commonly known as:  PROVERA Take 5 mg by mouth daily.   meloxicam 15 MG tablet Commonly known as:  MOBIC Take 1 tablet (15 mg total) by mouth daily.   metoprolol succinate 25 MG 24 hr tablet Commonly known as:  TOPROL-XL Take 25 mg by mouth daily.   pantoprazole 20 MG tablet Commonly known as:  PROTONIX Take 20 mg by mouth daily as needed (for heartburn, indigestion).   potassium chloride 8 MEQ tablet Commonly known as:  KLOR-CON Take 8 mEq by mouth daily. For cramps.   travoprost (benzalkonium) 0.004 % ophthalmic solution Commonly known as:  TRAVATAN Place 1 drop into both eyes at bedtime as needed (for eye pressure--patient preference).            Durable Medical Equipment        Start     Ordered   11/11/16 1055  For home use only DME Cane  Once     11/11/16 1054       Signed: Zekiah Coen A 11/11/2016, 11:02 AM

## 2016-11-11 NOTE — Discharge Instructions (Signed)

## 2016-12-19 NOTE — Addendum Note (Signed)
Addendum  created 12/19/16 1411 by Rica Koyanagi, MD   Sign clinical note

## 2016-12-19 NOTE — Anesthesia Postprocedure Evaluation (Signed)
Anesthesia Post Note  Patient: Lydia Hernandez  Procedure(s) Performed: Procedure(s) (LRB): Anterior Cervical Decompression Fusion  - Cervical three-Cervical four - Cervical four-Cervical five - Cervical five-Cervical six (N/A)     Anesthesia Post Evaluation  Last Vitals:  Vitals:   11/11/16 0734 11/11/16 1150  BP: 117/76 117/77  Pulse: 78 84  Resp: 20 18  Temp: 36.9 C 37.2 C    Last Pain:  Vitals:   11/11/16 1150  TempSrc: Oral  PainSc:                  Kadeen Sroka,JAMES TERRILL

## 2017-05-07 ENCOUNTER — Other Ambulatory Visit: Payer: Self-pay | Admitting: Anesthesiology

## 2017-06-04 ENCOUNTER — Other Ambulatory Visit: Payer: Self-pay

## 2017-06-04 ENCOUNTER — Encounter (HOSPITAL_COMMUNITY): Payer: Self-pay | Admitting: *Deleted

## 2017-06-04 NOTE — H&P (Signed)
Lydia Hernandez is an 61 y.o. female.   Chief Complaint:  Back and leg pain HPI:  Patient has a history of a  Prior lumbar surgery,  But still had continued chronic back pain, and radiculopathy; she had aprevious spinal cord stimulator trial back in 2013 but the results were somewhat inconclusive.  The patient had some neck issues and proceeded with an ACDF with Dr. Annette Stable several months ago and has had some improvement in her cervical pain. She unfortunately has had continued lumbar and lower extremity symptoms.  She was referred by Leonie Green, FNP for a repeat spinal cord stimulator trial.  We spent some time educating the patient on the trial process as well as permanent implant.  Ultimately the patient felt more comfortable re-trialing it to determine whether it was going to be effective for her current pain symptoms.  The patient is on multiple medications that include hydrocodone, meloxicam, Flexeril, gabapentin and CBD oil.  Her trial went quite well. She cannot recall how her previous trial went but she states this trial provided her at least 80% improvement in her symptoms.  She reports taking much less medication.  She is now scheduled for permanent SCS implant   Past Medical History:  Diagnosis Date  . Anxiety   . Chronic back pain   . Depression   . Fibromyalgia   . GERD (gastroesophageal reflux disease)   . Hiatal hernia   . History of tobacco abuse 05/23/2015  . Hypertension   . Hypothyroidism   . IBS (irritable bowel syndrome)   . Musculoskeletal pain 05/23/2015  . Neuropathic pain 05/23/2015  . Osteoarthritis     Past Surgical History:  Procedure Laterality Date  . ABDOMINAL HYSTERECTOMY    . ANKLE SURGERY Right   . ANTERIOR CERVICAL DECOMP/DISCECTOMY FUSION N/A 11/10/2016   Procedure: Anterior Cervical Decompression Fusion  - Cervical three-Cervical four - Cervical four-Cervical five - Cervical five-Cervical six;  Surgeon: Earnie Larsson, MD;  Location: [facility];  Service: Neurosurgery;   Laterality: N/A;  . BACK SURGERY    . CHOLECYSTECTOMY    . EYE SURGERY     bilateral cataracts  . HERNIA REPAIR    . NECK SURGERY      Family History  Problem Relation Age of Onset  . Cancer Mother   . Cancer Father    Social History:  reports that she has been smoking.  She has been smoking about 0.20 packs per day. she has never used smokeless tobacco. She reports that she does not drink alcohol or use drugs.  Allergies:  Allergies  Allergen Reactions  . Prednisone Nausea And Vomiting    Medications Prior to Admission  Medication Sig Dispense Refill  . cetirizine (ZYRTEC) 10 MG tablet Take 10 mg by mouth daily.    . cyclobenzaprine (FLEXERIL) 10 MG tablet Take 1 tablet (10 mg total) by mouth 3 (three) times daily as needed for muscle spasms. (Patient taking differently: Take 10 mg 2 (two) times daily as needed by mouth for muscle spasms. ) 90 tablet 2  . fluticasone (FLONASE) 50 MCG/ACT nasal spray Place 1 spray daily as needed into both nostrils for allergies or rhinitis.    . furosemide (LASIX) 20 MG tablet Take 20 mg by mouth daily as needed (for swelling).     . gabapentin (NEURONTIN) 300 MG capsule Take 1 capsule (300 mg total) by mouth 4 (four) times daily. (Patient taking differently: Take 300 mg by mouth 3 (three) times daily as needed (for  neuropathy pain). ) 120 capsule 2  . hydrochlorothiazide (HYDRODIURIL) 25 MG tablet Take 25 mg daily by mouth.    Marland Kitchen HYDROcodone-acetaminophen (NORCO) 10-325 MG tablet Take 1-2 tablets by mouth every 8 (eight) hours as needed (for pain.). (Patient taking differently: Take 1 tablet every 8 (eight) hours as needed by mouth (for pain.). ) 60 tablet 0  . medroxyPROGESTERone (PROVERA) 5 MG tablet Take 5 mg by mouth daily.     . meloxicam (MOBIC) 15 MG tablet Take 1 tablet (15 mg total) by mouth daily. 30 tablet 2  . metoprolol succinate (TOPROL-XL) 25 MG 24 hr tablet Take 25 mg by mouth daily.     Marland Kitchen OVER THE COUNTER MEDICATION Take 250 mg 2  (two) times daily as needed by mouth (pain). Hemp oil    . pantoprazole (PROTONIX) 20 MG tablet Take 20 mg by mouth daily as needed (for heartburn, indigestion).     . potassium chloride (K-DUR,KLOR-CON) 10 MEQ tablet Take 10 mEq daily by mouth.    . travoprost, benzalkonium, (TRAVATAN) 0.004 % ophthalmic solution Place 1 drop into both eyes at bedtime as needed (for eye pressure--patient preference).       Results for orders placed or performed during the hospital encounter of 06/05/17 (from the past 48 hour(s))  Surgical pcr screen     Status: None   Collection Time: 06/05/17  7:40 AM  Result Value Ref Range   MRSA, PCR NEGATIVE NEGATIVE   Staphylococcus aureus NEGATIVE NEGATIVE    Comment: (NOTE) The Xpert SA Assay (FDA approved for NASAL specimens in patients 44 years of age and older), is one component of a comprehensive surveillance program. It is not intended to diagnose infection nor to guide or monitor treatment.   APTT     Status: None   Collection Time: 06/05/17  7:40 AM  Result Value Ref Range   aPTT 35 24 - 36 seconds  Protime-INR     Status: None   Collection Time: 06/05/17  7:40 AM  Result Value Ref Range   Prothrombin Time 13.0 11.4 - 15.2 seconds   INR 8.82   Basic metabolic panel     Status: Abnormal   Collection Time: 06/05/17  7:40 AM  Result Value Ref Range   Sodium 137 135 - 145 mmol/L   Potassium 2.9 (L) 3.5 - 5.1 mmol/L   Chloride 102 101 - 111 mmol/L   CO2 26 22 - 32 mmol/L   Glucose, Bld 107 (H) 65 - 99 mg/dL   BUN 10 6 - 20 mg/dL   Creatinine, Ser 0.68 0.44 - 1.00 mg/dL   Calcium 9.3 8.9 - 10.3 mg/dL   GFR calc non Af Amer >60 >60 mL/min   GFR calc Af Amer >60 >60 mL/min    Comment: (NOTE) The eGFR has been calculated using the CKD EPI equation. This calculation has not been validated in all clinical situations. eGFR's persistently <60 mL/min signify possible Chronic Kidney Disease.    Anion gap 9 5 - 15  CBC     Status: None   Collection  Time: 06/05/17  7:40 AM  Result Value Ref Range   WBC 6.1 4.0 - 10.5 K/uL   RBC 4.56 3.87 - 5.11 MIL/uL   Hemoglobin 15.0 12.0 - 15.0 g/dL   HCT 42.2 36.0 - 46.0 %   MCV 92.5 78.0 - 100.0 fL   MCH 32.9 26.0 - 34.0 pg   MCHC 35.5 30.0 - 36.0 g/dL   RDW 13.1 11.5 -  15.5 %   Platelets 305 150 - 400 K/uL   No results found.  Review of Systems  Constitutional: Negative.   HENT: Negative.   Eyes: Negative.   Respiratory: Negative.   Cardiovascular: Negative.   Gastrointestinal: Negative.   Genitourinary: Negative.   Musculoskeletal: Positive for back pain. Negative for falls and myalgias.  Skin: Negative.   Neurological: Negative.   Endo/Heme/Allergies: Negative.   Psychiatric/Behavioral: Negative.     Blood pressure 113/76, pulse 84, temperature 98 F (36.7 C), temperature source Oral, resp. rate 18, height _0  (1.651 m), weight 86.2 kg (190 lb), SpO2 99 %. Physical Exam  Constitutional: She appears well-developed and well-nourished.  HENT:  Head: Normocephalic and atraumatic.  Eyes: EOM are normal. Pupils are equal, round, and reactive to light.  Neck: Normal range of motion.  Cardiovascular: Normal rate.  Respiratory: Effort normal.  GI: Soft.  Musculoskeletal: Normal range of motion.  Neurological: She is alert.  Skin: Skin is warm and dry.  Psychiatric: She has a normal mood and affect. Her behavior is normal. Judgment normal.     Assessment/Plan  lumbar post-laminectomy syndrome, chronic pain syndrome Plan:  SCS implant, [place], MD 06/05/2017, 9:28 AM

## 2017-06-05 ENCOUNTER — Ambulatory Visit (HOSPITAL_COMMUNITY): Payer: Medicare Other | Admitting: Certified Registered Nurse Anesthetist

## 2017-06-05 ENCOUNTER — Encounter (HOSPITAL_COMMUNITY): Payer: Self-pay | Admitting: Urology

## 2017-06-05 ENCOUNTER — Ambulatory Visit (HOSPITAL_COMMUNITY)
Admission: RE | Admit: 2017-06-05 | Discharge: 2017-06-05 | Disposition: A | Discharge status: Home / Self Care | Payer: Medicare Other | Source: Ambulatory Visit | Attending: Anesthesiology | Admitting: Anesthesiology

## 2017-06-05 ENCOUNTER — Encounter (HOSPITAL_COMMUNITY)
Admission: RE | Disposition: A | Discharge status: Home / Self Care | Payer: Self-pay | Source: Ambulatory Visit | Attending: Anesthesiology

## 2017-06-05 ENCOUNTER — Ambulatory Visit (HOSPITAL_COMMUNITY): Payer: Medicare Other

## 2017-06-05 DIAGNOSIS — Z79899 Other long term (current) drug therapy: Secondary | ICD-10-CM | POA: Diagnosis not present

## 2017-06-05 DIAGNOSIS — E039 Hypothyroidism, unspecified: Secondary | ICD-10-CM | POA: Diagnosis not present

## 2017-06-05 DIAGNOSIS — M797 Fibromyalgia: Secondary | ICD-10-CM | POA: Insufficient documentation

## 2017-06-05 DIAGNOSIS — M5416 Radiculopathy, lumbar region: Secondary | ICD-10-CM | POA: Insufficient documentation

## 2017-06-05 DIAGNOSIS — F17210 Nicotine dependence, cigarettes, uncomplicated: Secondary | ICD-10-CM | POA: Insufficient documentation

## 2017-06-05 DIAGNOSIS — Z419 Encounter for procedure for purposes other than remedying health state, unspecified: Secondary | ICD-10-CM

## 2017-06-05 DIAGNOSIS — Y838 Other surgical procedures as the cause of abnormal reaction of the patient, or of later complication, without mention of misadventure at the time of the procedure: Secondary | ICD-10-CM | POA: Insufficient documentation

## 2017-06-05 DIAGNOSIS — K219 Gastro-esophageal reflux disease without esophagitis: Secondary | ICD-10-CM | POA: Diagnosis not present

## 2017-06-05 DIAGNOSIS — Z791 Long term (current) use of non-steroidal anti-inflammatories (NSAID): Secondary | ICD-10-CM | POA: Insufficient documentation

## 2017-06-05 DIAGNOSIS — M961 Postlaminectomy syndrome, not elsewhere classified: Secondary | ICD-10-CM | POA: Insufficient documentation

## 2017-06-05 DIAGNOSIS — Z7951 Long term (current) use of inhaled steroids: Secondary | ICD-10-CM | POA: Diagnosis not present

## 2017-06-05 DIAGNOSIS — I10 Essential (primary) hypertension: Secondary | ICD-10-CM | POA: Insufficient documentation

## 2017-06-05 DIAGNOSIS — G894 Chronic pain syndrome: Secondary | ICD-10-CM | POA: Diagnosis not present

## 2017-06-05 HISTORY — DX: Allergy status to unspecified drugs, medicaments and biological substances: Z88.9

## 2017-06-05 HISTORY — DX: Other allergy status, other than to drugs and biological substances: Z91.09

## 2017-06-05 HISTORY — PX: SPINAL CORD STIMULATOR INSERTION: SHX5378

## 2017-06-05 LAB — BASIC METABOLIC PANEL
Anion gap: 9 (ref 5–15)
BUN: 10 mg/dL (ref 6–20)
CHLORIDE: 102 mmol/L (ref 101–111)
CO2: 26 mmol/L (ref 22–32)
CREATININE: 0.68 mg/dL (ref 0.44–1.00)
Calcium: 9.3 mg/dL (ref 8.9–10.3)
GFR calc non Af Amer: >60 mL/min (ref >60)
Glucose, Bld: 107 mg/dL — ABNORMAL HIGH (ref 65–99)
Potassium: 2.9 mmol/L — ABNORMAL LOW (ref 3.5–5.1)
SODIUM: 137 mmol/L (ref 135–145)

## 2017-06-05 LAB — CBC
HCT: 42.2 % (ref 36.0–46.0)
Hemoglobin: 15 g/dL (ref 12.0–15.0)
MCH: 32.9 pg (ref 26.0–34.0)
MCHC: 35.5 g/dL (ref 30.0–36.0)
MCV: 92.5 fL (ref 78.0–100.0)
PLATELETS: 305 10*3/uL (ref 150–400)
RBC: 4.56 MIL/uL (ref 3.87–5.11)
RDW: 13.1 % (ref 11.5–15.5)
WBC: 6.1 10*3/uL (ref 4.0–10.5)

## 2017-06-05 LAB — PROTIME-INR
INR: 0.99
Prothrombin Time: 13 seconds (ref 11.4–15.2)

## 2017-06-05 LAB — SURGICAL PCR SCREEN
MRSA, PCR: NEGATIVE
Staphylococcus aureus: NEGATIVE

## 2017-06-05 LAB — APTT: aPTT: 35 seconds (ref 24–36)

## 2017-06-05 SURGERY — INSERTION, SPINAL CORD STIMULATOR, LUMBAR
Anesthesia: General

## 2017-06-05 MED ORDER — OXYCODONE HCL 5 MG PO TABS
ORAL_TABLET | ORAL | Status: AC
  Administered 2017-06-05: 5 mg via ORAL
  Filled 2017-06-05: qty 1

## 2017-06-05 MED ORDER — DEXAMETHASONE SODIUM PHOSPHATE 10 MG/ML IJ SOLN
INTRAMUSCULAR | Status: AC
  Filled 2017-06-05: qty 1

## 2017-06-05 MED ORDER — HYDROMORPHONE HCL 1 MG/ML IJ SOLN
0.2500 mg | INTRAMUSCULAR | Status: DC | PRN
  Administered 2017-06-05 (×2): 0.5 mg via INTRAVENOUS

## 2017-06-05 MED ORDER — FENTANYL CITRATE (PF) 250 MCG/5ML IJ SOLN
INTRAMUSCULAR | Status: AC
  Filled 2017-06-05: qty 5

## 2017-06-05 MED ORDER — PHENYLEPHRINE HCL 10 MG/ML IJ SOLN
INTRAMUSCULAR | Status: DC | PRN
  Administered 2017-06-05: 80 ug via INTRAVENOUS
  Administered 2017-06-05: 120 ug via INTRAVENOUS
  Administered 2017-06-05: 80 ug via INTRAVENOUS
  Administered 2017-06-05: 120 ug via INTRAVENOUS
  Administered 2017-06-05: 80 ug via INTRAVENOUS

## 2017-06-05 MED ORDER — SUCCINYLCHOLINE CHLORIDE 200 MG/10ML IV SOSY
PREFILLED_SYRINGE | INTRAVENOUS | Status: AC
  Filled 2017-06-05: qty 10

## 2017-06-05 MED ORDER — BUPIVACAINE-EPINEPHRINE (PF) 0.5% -1:200000 IJ SOLN
INTRAMUSCULAR | Status: AC
  Filled 2017-06-05: qty 30

## 2017-06-05 MED ORDER — LIDOCAINE 2% (20 MG/ML) 5 ML SYRINGE
INTRAMUSCULAR | Status: AC
  Filled 2017-06-05: qty 5

## 2017-06-05 MED ORDER — OXYCODONE HCL 5 MG/5ML PO SOLN: 5.0000 mg | Freq: Once | ORAL | Status: AC | PRN

## 2017-06-05 MED ORDER — EPHEDRINE 5 MG/ML INJ
INTRAVENOUS | Status: AC
  Filled 2017-06-05: qty 10

## 2017-06-05 MED ORDER — SUGAMMADEX SODIUM 200 MG/2ML IV SOLN
INTRAVENOUS | Status: DC | PRN
  Administered 2017-06-05: 200 mg via INTRAVENOUS

## 2017-06-05 MED ORDER — PROMETHAZINE HCL 25 MG/ML IJ SOLN: 6.2500 mg | INTRAMUSCULAR | Status: DC | PRN

## 2017-06-05 MED ORDER — BACITRACIN-NEOMYCIN-POLYMYXIN OINTMENT TUBE
TOPICAL_OINTMENT | CUTANEOUS | Status: DC | PRN
  Administered 2017-06-05: 1 via TOPICAL

## 2017-06-05 MED ORDER — MIDAZOLAM HCL 2 MG/2ML IJ SOLN
INTRAMUSCULAR | Status: AC
  Filled 2017-06-05: qty 2

## 2017-06-05 MED ORDER — ONDANSETRON HCL 4 MG/2ML IJ SOLN
INTRAMUSCULAR | Status: DC | PRN
  Administered 2017-06-05: 4 mg via INTRAVENOUS

## 2017-06-05 MED ORDER — PHENYLEPHRINE 40 MCG/ML (10ML) SYRINGE FOR IV PUSH (FOR BLOOD PRESSURE SUPPORT)
PREFILLED_SYRINGE | INTRAVENOUS | Status: AC
  Filled 2017-06-05: qty 10

## 2017-06-05 MED ORDER — HYDROMORPHONE HCL 1 MG/ML IJ SOLN
INTRAMUSCULAR | Status: AC
  Administered 2017-06-05: 0.5 mg via INTRAVENOUS
  Filled 2017-06-05: qty 1

## 2017-06-05 MED ORDER — PROPOFOL 10 MG/ML IV BOLUS
INTRAVENOUS | Status: DC | PRN
  Administered 2017-06-05: 150 mg via INTRAVENOUS

## 2017-06-05 MED ORDER — SUGAMMADEX SODIUM 200 MG/2ML IV SOLN
INTRAVENOUS | Status: AC
  Filled 2017-06-05: qty 2

## 2017-06-05 MED ORDER — OXYCODONE HCL 5 MG PO TABS
5.0000 mg | ORAL_TABLET | ORAL | Status: AC | PRN
  Administered 2017-06-05: 5 mg via ORAL

## 2017-06-05 MED ORDER — CEFAZOLIN SODIUM-DEXTROSE 2-4 GM/100ML-% IV SOLN
INTRAVENOUS | Status: AC
  Filled 2017-06-05: qty 100

## 2017-06-05 MED ORDER — SODIUM CHLORIDE 0.9 % IR SOLN
Status: DC | PRN
  Administered 2017-06-05: 10:00:00

## 2017-06-05 MED ORDER — CLINDAMYCIN HCL 150 MG PO CAPS: 150.0000 mg | ORAL_CAPSULE | Freq: Three times a day (TID) | ORAL | 0 refills | Status: AC

## 2017-06-05 MED ORDER — 0.9 % SODIUM CHLORIDE (POUR BTL) OPTIME
TOPICAL | Status: DC | PRN
  Administered 2017-06-05: 1000 mL

## 2017-06-05 MED ORDER — LIDOCAINE HCL (CARDIAC) 20 MG/ML IV SOLN
INTRAVENOUS | Status: DC | PRN
  Administered 2017-06-05: 80 mg via INTRAVENOUS

## 2017-06-05 MED ORDER — MEPERIDINE HCL 25 MG/ML IJ SOLN: 6.2500 mg | INTRAMUSCULAR | Status: DC | PRN

## 2017-06-05 MED ORDER — ONDANSETRON HCL 4 MG/2ML IJ SOLN
INTRAMUSCULAR | Status: AC
  Filled 2017-06-05: qty 2

## 2017-06-05 MED ORDER — PROPOFOL 10 MG/ML IV BOLUS
INTRAVENOUS | Status: AC
  Filled 2017-06-05: qty 20

## 2017-06-05 MED ORDER — ROCURONIUM BROMIDE 10 MG/ML (PF) SYRINGE
PREFILLED_SYRINGE | INTRAVENOUS | Status: AC
  Filled 2017-06-05: qty 5

## 2017-06-05 MED ORDER — CHLORHEXIDINE GLUCONATE CLOTH 2 % EX PADS: 6.0000 | MEDICATED_PAD | Freq: Once | CUTANEOUS | Status: DC

## 2017-06-05 MED ORDER — LACTATED RINGERS IV SOLN
INTRAVENOUS | Status: DC
Start: 2017-06-05 — End: 2017-06-05
  Administered 2017-06-05 (×2): via INTRAVENOUS

## 2017-06-05 MED ORDER — CEFAZOLIN SODIUM-DEXTROSE 2-4 GM/100ML-% IV SOLN
2.0000 g | INTRAVENOUS | Status: AC
  Administered 2017-06-05: 2 g via INTRAVENOUS

## 2017-06-05 MED ORDER — OXYCODONE HCL 5 MG PO TABS
5.0000 mg | ORAL_TABLET | Freq: Once | ORAL | Status: AC | PRN
  Administered 2017-06-05: 5 mg via ORAL

## 2017-06-05 MED ORDER — MIDAZOLAM HCL 5 MG/5ML IJ SOLN
INTRAMUSCULAR | Status: DC | PRN
  Administered 2017-06-05: 1 mg via INTRAVENOUS

## 2017-06-05 MED ORDER — BUPIVACAINE-EPINEPHRINE (PF) 0.5% -1:200000 IJ SOLN
INTRAMUSCULAR | Status: DC | PRN
  Administered 2017-06-05: 29 mL via PERINEURAL

## 2017-06-05 MED ORDER — ROCURONIUM BROMIDE 100 MG/10ML IV SOLN
INTRAVENOUS | Status: DC | PRN
  Administered 2017-06-05: 50 mg via INTRAVENOUS

## 2017-06-05 MED ORDER — FENTANYL CITRATE (PF) 100 MCG/2ML IJ SOLN
INTRAMUSCULAR | Status: DC | PRN
  Administered 2017-06-05: 100 ug via INTRAVENOUS
  Administered 2017-06-05 (×2): 25 ug via INTRAVENOUS

## 2017-06-05 MED ORDER — HYDROCODONE-ACETAMINOPHEN 10-325 MG PO TABS: 1.0000 | ORAL_TABLET | ORAL | 0 refills | Status: DC | PRN

## 2017-06-05 MED ORDER — DOUBLE ANTIBIOTIC 500-10000 UNIT/GM EX OINT
TOPICAL_OINTMENT | CUTANEOUS | Status: AC
Start: 2017-06-05 — End: 2017-06-05
  Filled 2017-06-05: qty 1

## 2017-06-05 SURGICAL SUPPLY — 65 items
ADH SKN CLS APL DERMABOND .7 (GAUZE/BANDAGES/DRESSINGS) ×1
ANCH LD 4 SETX2 CLIK X (Stimulator) ×1 IMPLANT
ANCHOR CLIK X NEURO (Stimulator) ×1 IMPLANT
APL SKNCLS STERI-STRIP NONHPOA (GAUZE/BANDAGES/DRESSINGS)
BAG DECANTER FOR FLEXI CONT (MISCELLANEOUS) ×2 IMPLANT
BENZOIN TINCTURE PRP APPL 2/3 (GAUZE/BANDAGES/DRESSINGS) IMPLANT
BINDER ABDOMINAL 12 ML 46-62 (SOFTGOODS) ×2 IMPLANT
BLADE CLIPPER SURG (BLADE) IMPLANT
CHLORAPREP W/TINT 26ML (MISCELLANEOUS) ×2 IMPLANT
CLIP VESOCCLUDE SM WIDE 6/CT (CLIP) IMPLANT
DERMABOND ADVANCED (GAUZE/BANDAGES/DRESSINGS) ×1
DERMABOND ADVANCED .7 DNX12 (GAUZE/BANDAGES/DRESSINGS) ×1 IMPLANT
DRAPE C-ARM 42X72 X-RAY (DRAPES) ×2 IMPLANT
DRAPE C-ARMOR (DRAPES) ×2 IMPLANT
DRAPE LAPAROTOMY 100X72X124 (DRAPES) ×2 IMPLANT
DRAPE POUCH INSTRU U-SHP 10X18 (DRAPES) ×2 IMPLANT
DRAPE SURG 17X23 STRL (DRAPES) ×2 IMPLANT
DRSG OPSITE POSTOP 3X4 (GAUZE/BANDAGES/DRESSINGS) ×2 IMPLANT
ELECT REM PT RETURN 9FT ADLT (ELECTROSURGICAL) ×2
ELECTRODE REM PT RTRN 9FT ADLT (ELECTROSURGICAL) ×1 IMPLANT
GAUZE SPONGE 4X4 16PLY XRAY LF (GAUZE/BANDAGES/DRESSINGS) ×2 IMPLANT
GENERATOR NEUROSTIMULATOR (Neurostimulator) ×1 IMPLANT
GLOVE BIOGEL PI IND STRL 7.5 (GLOVE) ×1 IMPLANT
GLOVE BIOGEL PI INDICATOR 7.5 (GLOVE) ×1
GLOVE ECLIPSE 7.5 STRL STRAW (GLOVE) ×2 IMPLANT
GLOVE EXAM NITRILE LRG STRL (GLOVE) IMPLANT
GLOVE EXAM NITRILE XL STR (GLOVE) IMPLANT
GLOVE EXAM NITRILE XS STR PU (GLOVE) IMPLANT
GOWN STRL REUS W/ TWL LRG LVL3 (GOWN DISPOSABLE) IMPLANT
GOWN STRL REUS W/ TWL XL LVL3 (GOWN DISPOSABLE) IMPLANT
GOWN STRL REUS W/TWL 2XL LVL3 (GOWN DISPOSABLE) IMPLANT
GOWN STRL REUS W/TWL LRG LVL3 (GOWN DISPOSABLE) ×4
GOWN STRL REUS W/TWL XL LVL3 (GOWN DISPOSABLE)
KIT BASIN OR (CUSTOM PROCEDURE TRAY) ×2 IMPLANT
KIT CHARGING (KITS) ×1
KIT CHARGING PRECISION NEURO (KITS) IMPLANT
KIT REMOTE CONTROL 112802 FREE (KITS) ×1 IMPLANT
KIT ROOM TURNOVER OR (KITS) ×2 IMPLANT
LEAD INFINION CX PERC 70CM (Cable) ×2 IMPLANT
NDL 18GX1X1/2 (RX/OR ONLY) (NEEDLE) IMPLANT
NDL ENTRADA 4.5IN (NEEDLE) IMPLANT
NDL HYPO 25X1 1.5 SAFETY (NEEDLE) ×1 IMPLANT
NEEDLE 18GX1X1/2 (RX/OR ONLY) (NEEDLE) IMPLANT
NEEDLE ENTRADA 4.5IN (NEEDLE) ×4 IMPLANT
NEEDLE HYPO 25X1 1.5 SAFETY (NEEDLE) ×2 IMPLANT
NS IRRIG 1000ML POUR BTL (IV SOLUTION) ×2 IMPLANT
PACK LAMINECTOMY NEURO (CUSTOM PROCEDURE TRAY) ×2 IMPLANT
PAD ARMBOARD 7.5X6 YLW CONV (MISCELLANEOUS) ×2 IMPLANT
SPONGE LAP 4X18 X RAY DECT (DISPOSABLE) ×2 IMPLANT
SPONGE SURGIFOAM ABS GEL SZ50 (HEMOSTASIS) IMPLANT
STAPLER SKIN PROX WIDE 3.9 (STAPLE) ×2 IMPLANT
STRIP CLOSURE SKIN 1/2X4 (GAUZE/BANDAGES/DRESSINGS) IMPLANT
SUT MNCRL AB 4-0 PS2 18 (SUTURE) IMPLANT
SUT SILK 0 (SUTURE) ×2
SUT SILK 0 MO-6 18XCR BRD 8 (SUTURE) ×1 IMPLANT
SUT SILK 0 TIES 10X30 (SUTURE) IMPLANT
SUT SILK 2 0 TIES 10X30 (SUTURE) IMPLANT
SUT VIC AB 2-0 CP2 18 (SUTURE) ×5 IMPLANT
SYR 10ML LL (SYRINGE) IMPLANT
SYR EPIDURAL 5ML GLASS (SYRINGE) ×2 IMPLANT
TOOL LONG TUNNEL (SPINAL CORD STIMULATOR) ×1 IMPLANT
TOWEL GREEN STERILE (TOWEL DISPOSABLE) ×2 IMPLANT
TOWEL GREEN STERILE FF (TOWEL DISPOSABLE) ×2 IMPLANT
WATER STERILE IRR 1000ML POUR (IV SOLUTION) ×2 IMPLANT
YANKAUER SUCT BULB TIP NO VENT (SUCTIONS) ×2 IMPLANT

## 2017-06-05 NOTE — Anesthesia Preprocedure Evaluation (Signed)
Anesthesia Evaluation  Patient identified by MRN, date of birth, ID band Patient awake    Reviewed: Allergy & Precautions, H&P , NPO status , Patient's Chart, lab work & pertinent test results, reviewed documented beta blocker date and time   Airway Mallampati: II  TM Distance: >3 FB Neck ROM: Full    Dental no notable dental hx. (+) Edentulous Upper, Partial Lower, Poor Dentition, Dental Advisory Given   Pulmonary Current Smoker,    Pulmonary exam normal breath sounds clear to auscultation       Cardiovascular hypertension, Pt. on medications and Pt. on home beta blockers  Rhythm:Regular Rate:Normal     Neuro/Psych Anxiety Depression negative neurological ROS     GI/Hepatic Neg liver ROS, GERD  Medicated and Controlled,  Endo/Other  Hypothyroidism   Renal/GU negative Renal ROS  negative genitourinary   Musculoskeletal  (+) Arthritis , Osteoarthritis,  Fibromyalgia -  Abdominal   Peds  Hematology negative hematology ROS (+)   Anesthesia Other Findings   Reproductive/Obstetrics negative OB ROS                             Anesthesia Physical  Anesthesia Plan  ASA: III  Anesthesia Plan: General   Post-op Pain Management:    Induction: Intravenous  PONV Risk Score and Plan: 2 and Ondansetron and Midazolam  Airway Management Planned: Oral ETT  Additional Equipment:   Intra-op Plan:   Post-operative Plan: Extubation in OR  Informed Consent: I have reviewed the patients History and Physical, chart, labs and discussed the procedure including the risks, benefits and alternatives for the proposed anesthesia with the patient or authorized representative who has indicated his/her understanding and acceptance.   Dental advisory given  Plan Discussed with: CRNA  Anesthesia Plan Comments:         Anesthesia Quick Evaluation

## 2017-06-05 NOTE — Transfer of Care (Signed)
Immediate Anesthesia Transfer of Care Note  Patient: Lydia Hernandez  Procedure(s) Performed: LUMBAR SPINAL CORD STIMULATOR INSERTION (N/A )  Patient Location: PACU  Anesthesia Type:General  Level of Consciousness: awake, alert , oriented and patient cooperative  Airway & Oxygen Therapy: Patient Spontanous Breathing and Patient connected to nasal cannula oxygen  Post-op Assessment: Report given to RN and Post -op Vital signs reviewed and stable  Post vital signs: Reviewed and stable  Last Vitals:  Vitals:   06/05/17 0806  BP: 113/76  Pulse: 84  Resp: 18  Temp: 36.7 C  SpO2: 99%    Last Pain:  Vitals:   06/05/17 0806  TempSrc: Oral  PainSc:       Patients Stated Pain Goal: 5 (93/55/21 7471)  Complications: No apparent anesthesia complications

## 2017-06-05 NOTE — Discharge Instructions (Signed)
Dr. Debrina Kizer Post-Op Orders ° °• Ice Pack - 20 minutes on (in a pillow case), and 20 minutes off. Wear the ice pack UNDER the binder. °• Follow up in office, they will call you for an appointment in 10 days to 2 weeks. °• Increase activity gradually.   °• No lifting anything heavier than a gallon of milk (10 pounds) until seen in the office. °• Advance diet slowly as tolerated. °• Dressing care:  Keep dressing dry for 3 days, and on Post-op day 4, may shower. °• Call for fever, drainage, and redness. °• No swimming or bathing in a bathtub (do not get into standing water). °•  °

## 2017-06-05 NOTE — Op Note (Signed)
PREOP DX: 1) lumbago  2) lumbar radiculopathy  3) lumbar post-laminectomy syndrome  4) chronic pain  POSTOP DX: 1) lumbago  2) lumbar radiculopathy  3) lumbar post-laminectomy syndrome  4) chronic pain PROCEDURES PERFORMED:1) intraop fluoro 2) placement of 2 16 contact [employer] Infinion leads 3) placement of Spectra SCS generator  SURGEON:Ji Fairburn  ASSISTANT: NONE  ANESTHESIA: GETA EBL: <20cc  DESCRIPTION OF PROCEDURE: After a discussion of risks, benefits and alternatives, informed consent was obtained. The patient was taken to the OR, general anesthesia induced by the anesthesia team, turned prone onto a Jackson table, all pressure points padded, SCD's placed, and an adequate plane of anesthesia induced. A timeout was taken to verify the correct patient, position, personnel, availability of appropriate equipment, and administration of perioperative antibiotics.   The thoracic and lumbar areas were widely prepped with chloraprep and draped into a sterile field. Fluoroscopy was used to plan a right paramedian incision at the L1-L2 levels, and an incision made with a 10 blade and carried down to the dorsolumbar fascia with the bovie and blunt dissection. Retractors were placed and a 14g [employer] tuohy needle placed into the epidural space at the T12-L1 interspace using biplanar fluoro and loss-of-resistance technique. The needle was aspirated without any return of fluid. A [employer] INFINION lead was introduced and under live AP fluoro advanced until the 2 distal-most contacts overlay the inferior aspect T7 vertebral body shadow with the rest of the contacts distributed over the T8 and T9vertebral bodies in a position just right of anatomic midline. A second Infinion lead was placed just left of anatomic midline in the same levels using the  same technique.   0 silk sutures were placed in the fascia adjacent to the needles. The needles and stylets were removed under fluoroscopy with no lead migration noted. Leads were then fixed to the fascia by Clik anchors with the sutures; repeat images were obtained to verify that there had been no lead migration.  The incision was inspected and hemostasis obtained with the bipolar cautery.  Attention was then turned to creation of a subcutaneous pocket. At the right flank, a 3 cm incision was made with a 10 blade and using the bovie and blunt dissection a pocket of size appropriate to place a SCS generator. The pocket was trialed, and found to be of adequate size. The pocket was inspected for hemostasis, which was found to be excellent. Using reverse seldinger technique, the leads were tunneled to the pocket site, and the leads inserted into the SCS generator. Impedances were checked, and all found to be excellent. The leads were then all fixed into position with a self-torquing wrench. The wiring was all carefully coiled, placed behind the generator and placed in the pocket.  Both incisions were copiously irrigated with bacitracin-containing irrigation. The lumbar incision was closed in 2 deep layers of interrupted 2-0 vicryl and the skin closed with staples. The pocket incision was closed with a deeper layer of 2-0 vicryl interrupted sutures, and the skin closed with staples. Sterile dressings were applied. Needle, sponge, and instrument counts were correct x2 at the end of the case.  The patient was then carefully awakened from anesthesia, turned supine, an abdominal binder placed, and the patient taken to the recovery room where he underwent complex spinal cord stimulator programming.  COMPLICATIONS: NONE  CONDITION: Stable throughout the course of the procedure and immediately afterward  DISPOSITION: discharge to home, with antibiotics and pain medicine. Discussed care with the patient  and  spouse. Followup in clinic will be scheduled in 10-14 days.

## 2017-06-05 NOTE — Anesthesia Procedure Notes (Signed)
Procedure Name: Intubation Date/Time: 06/05/2017 9:43 AM Performed by: Shirlyn Goltz, CRNA Pre-anesthesia Checklist: Patient identified, Emergency Drugs available, Suction available and Patient being monitored Patient Re-evaluated:Patient Re-evaluated prior to induction Oxygen Delivery Method: Circle system utilized Preoxygenation: Pre-oxygenation with 100% oxygen Induction Type: IV induction Ventilation: Mask ventilation without difficulty and Oral airway inserted - appropriate to patient size Laryngoscope Size: Mac and 3 Grade View: Grade I Tube type: Oral Tube size: 7.0 mm Number of attempts: 1 Airway Equipment and Method: Stylet Placement Confirmation: ETT inserted through vocal cords under direct vision,  positive ETCO2 and breath sounds checked- equal and bilateral Secured at: 20 cm Tube secured with: Tape Dental Injury: Teeth and Oropharynx as per pre-operative assessment

## 2017-06-05 NOTE — Anesthesia Postprocedure Evaluation (Signed)
Anesthesia Post Note  Patient: Lydia Hernandez  Procedure(s) Performed: LUMBAR SPINAL CORD STIMULATOR INSERTION (N/A )     Patient location during evaluation: PACU Anesthesia Type: General Level of consciousness: awake and alert Pain management: pain level controlled Vital Signs Assessment: post-procedure vital signs reviewed and stable Respiratory status: spontaneous breathing, nonlabored ventilation and respiratory function stable Cardiovascular status: blood pressure returned to baseline and stable Postop Assessment: no apparent nausea or vomiting Anesthetic complications: no    Last Vitals:  Vitals:   06/05/17 1130 06/05/17 1142  BP: 132/72 (!) 144/87  Pulse: 82 82  Resp: 14 15  Temp:    SpO2: 98% 100%    Last Pain:  Vitals:   06/05/17 1142  TempSrc:   PainSc: Asleep                 Lynda Rainwater

## 2017-06-08 ENCOUNTER — Encounter (HOSPITAL_COMMUNITY): Payer: Self-pay | Admitting: Anesthesiology

## 2017-09-08 DIAGNOSIS — Z8639 Personal history of other endocrine, nutritional and metabolic disease: Secondary | ICD-10-CM

## 2017-09-08 DIAGNOSIS — R609 Edema, unspecified: Secondary | ICD-10-CM

## 2017-09-08 DIAGNOSIS — K581 Irritable bowel syndrome with constipation: Secondary | ICD-10-CM

## 2017-09-08 DIAGNOSIS — E66812 Obesity, class 2: Secondary | ICD-10-CM

## 2017-09-08 DIAGNOSIS — R600 Localized edema: Secondary | ICD-10-CM

## 2017-09-08 DIAGNOSIS — F330 Major depressive disorder, recurrent, mild: Secondary | ICD-10-CM

## 2017-09-08 DIAGNOSIS — Z6835 Body mass index (BMI) 35.0-35.9, adult: Secondary | ICD-10-CM

## 2017-09-08 DIAGNOSIS — N959 Unspecified menopausal and perimenopausal disorder: Secondary | ICD-10-CM | POA: Insufficient documentation

## 2017-09-08 DIAGNOSIS — M519 Unspecified thoracic, thoracolumbar and lumbosacral intervertebral disc disorder: Secondary | ICD-10-CM

## 2017-09-08 DIAGNOSIS — E6609 Other obesity due to excess calories: Secondary | ICD-10-CM

## 2017-09-08 HISTORY — DX: Localized edema: R60.0

## 2017-09-08 HISTORY — DX: Major depressive disorder, recurrent, mild: F33.0

## 2017-09-08 HISTORY — DX: Body mass index (BMI) 35.0-35.9, adult: Z68.35

## 2017-09-08 HISTORY — DX: Obesity, class 2: E66.812

## 2017-09-08 HISTORY — DX: Unspecified menopausal and perimenopausal disorder: N95.9

## 2017-09-08 HISTORY — DX: Irritable bowel syndrome with constipation: K58.1

## 2017-09-08 HISTORY — DX: Other obesity due to excess calories: E66.09

## 2017-09-08 HISTORY — DX: Personal history of other endocrine, nutritional and metabolic disease: Z86.39

## 2017-09-08 HISTORY — DX: Unspecified thoracic, thoracolumbar and lumbosacral intervertebral disc disorder: M51.9

## 2017-09-08 HISTORY — DX: Edema, unspecified: R60.9

## 2018-02-25 DIAGNOSIS — E782 Mixed hyperlipidemia: Secondary | ICD-10-CM

## 2018-02-25 HISTORY — DX: Mixed hyperlipidemia: E78.2

## 2018-11-24 DIAGNOSIS — F17200 Nicotine dependence, unspecified, uncomplicated: Secondary | ICD-10-CM

## 2018-11-24 DIAGNOSIS — J310 Chronic rhinitis: Secondary | ICD-10-CM

## 2018-11-24 DIAGNOSIS — R269 Unspecified abnormalities of gait and mobility: Secondary | ICD-10-CM

## 2018-11-24 HISTORY — DX: Nicotine dependence, unspecified, uncomplicated: F17.200

## 2018-11-24 HISTORY — DX: Unspecified abnormalities of gait and mobility: R26.9

## 2018-11-24 HISTORY — DX: Chronic rhinitis: J31.0

## 2019-09-22 DIAGNOSIS — R7303 Prediabetes: Secondary | ICD-10-CM

## 2019-09-22 HISTORY — DX: Prediabetes: R73.03

## 2020-11-27 ENCOUNTER — Other Ambulatory Visit: Payer: Self-pay

## 2020-11-27 DIAGNOSIS — Z889 Allergy status to unspecified drugs, medicaments and biological substances status: Secondary | ICD-10-CM | POA: Insufficient documentation

## 2020-11-27 DIAGNOSIS — G8929 Other chronic pain: Secondary | ICD-10-CM | POA: Insufficient documentation

## 2020-11-27 DIAGNOSIS — Z9109 Other allergy status, other than to drugs and biological substances: Secondary | ICD-10-CM | POA: Insufficient documentation

## 2020-11-27 DIAGNOSIS — F419 Anxiety disorder, unspecified: Secondary | ICD-10-CM | POA: Insufficient documentation

## 2020-11-27 DIAGNOSIS — I10 Essential (primary) hypertension: Secondary | ICD-10-CM | POA: Insufficient documentation

## 2020-11-27 DIAGNOSIS — M549 Dorsalgia, unspecified: Secondary | ICD-10-CM | POA: Insufficient documentation

## 2020-11-27 DIAGNOSIS — M1990 Unspecified osteoarthritis, unspecified site: Secondary | ICD-10-CM | POA: Insufficient documentation

## 2020-11-28 ENCOUNTER — Ambulatory Visit: Payer: Medicare Other | Admitting: Cardiology

## 2020-11-28 ENCOUNTER — Other Ambulatory Visit: Payer: Self-pay

## 2020-11-28 ENCOUNTER — Encounter: Payer: Self-pay | Admitting: Cardiology

## 2020-11-28 ENCOUNTER — Other Ambulatory Visit: Payer: Self-pay | Admitting: Cardiology

## 2020-11-28 VITALS — BP 136/78 | HR 70 | Ht 65.6 in | Wt 189.6 lb

## 2020-11-28 DIAGNOSIS — I714 Abdominal aortic aneurysm, without rupture, unspecified: Secondary | ICD-10-CM

## 2020-11-28 DIAGNOSIS — I2510 Atherosclerotic heart disease of native coronary artery without angina pectoris: Secondary | ICD-10-CM

## 2020-11-28 DIAGNOSIS — E782 Mixed hyperlipidemia: Secondary | ICD-10-CM

## 2020-11-28 DIAGNOSIS — I10 Essential (primary) hypertension: Secondary | ICD-10-CM

## 2020-11-28 DIAGNOSIS — F17200 Nicotine dependence, unspecified, uncomplicated: Secondary | ICD-10-CM

## 2020-11-28 DIAGNOSIS — R011 Cardiac murmur, unspecified: Secondary | ICD-10-CM

## 2020-11-28 MED ORDER — ROSUVASTATIN CALCIUM 10 MG PO TABS: 10.0000 mg | ORAL_TABLET | Freq: Every day | ORAL | 3 refills | Status: DC

## 2020-11-28 NOTE — Progress Notes (Signed)
Cardiology Office Note:    Date:  11/28/2020   ID:  Lydia Hernandez, DOB 30-Sep-1955, MRN [id number]  PCP:  Lydia Hernandez., MD  Cardiologist:  Lydia Lindau, MD   Referring MD: Lydia Hernandez., MD    ASSESSMENT:    1. Essential hypertension   2. Mixed hyperlipidemia   3. Tobacco dependence   4. AAA (abdominal aortic aneurysm) without rupture ([place])   5. Atherosclerotic cardiovascular disease    PLAN:    In order of problems listed above:  1. Atherosclerotic vascular disease and aortic abdominal aneurysm: I reviewed the report with her extensively.  Secondary prevention stressed with patient.  Importance of compliance with diet medication stressed and she vocalized understanding.  In view of this I will do blood work today and initiated on statin therapy.  Lipids are mildly elevated and they were checked in March. 2. Essential hypertension: Blood pressure stable and diet was emphasized.  Lifestyle modification urged. 3. Mixed dyslipidemia: As mentioned above we will initiate statin therapy and she will have blood work in 6 weeks. 4. Cigarette smoker: I spent 5 minutes with the patient discussing solely about smoking. Smoking cessation was counseled. I suggested to the patient also different medications and pharmacological interventions. Patient is keen to try stopping on its own at this time. He will get back to me if he needs any further assistance in this matter. 5. Obesity: Weight reduction was stressed and she promises to do better. 6. Cardiac murmur: Echocardiogram will be done to assess murmur heard on auscultation.   Medication Adjustments/Labs and Tests Ordered: Current medicines are reviewed at length with the patient today.  Concerns regarding medicines are outlined above.  No orders of the defined types were placed in this encounter.  No orders of the defined types were placed in this encounter.    History of Present Illness:    Lydia Hernandez is a 65 y.o. female who is  being seen today for the evaluation of abdominal aortic aneurysm and atherosclerosis at the request of Lydia Hernandez., MD.  Patient is a pleasant 65 year old female.  She has past medical history of essential hypertension, dyslipidemia and cigarette smoking.  She has significant orthopedic issues with her back.  She was evaluated for this and in the process of evaluation with a CT scan and abdominal aortic aneurysm was noted.  She denies any chest pain orthopnea or PND.  However she leads a sedentary lifestyle.  She has multiple risk factors for coronary artery disease.  At the time of my evaluation, the patient is alert awake oriented and in no distress.  Past Medical History:  Diagnosis Date  . Abnormal gait 11/24/2018  . Anxiety   . Cervical foraminal stenosis at C5-6 05/23/2015  . Cervical spondylosis 05/23/2015   Multiple disc protrusions noted at C3-4, C4-5, C5-6, and C6-7. Large central and left paracentral disc protrusion at C6-7 left C6-7 neural foraminal narrowing secondary to the disc protrusion.   . Cervical stenosis of spinal canal 11/10/2016  . Chronic back pain   . Chronic constipation 05/23/2015  . Chronic low back pain (Location of Secondary source of pain) (Bilateral) (L>R) 05/22/2015  . Chronic lower extremity pain (Location of Primary Source of Pain) (Bilateral) (L>R) 05/23/2015  . Chronic lumbar radicular pain (Location of Primary Source of Pain) (Left) 05/22/2015  . Chronic neck pain (Location of Tertiary source of pain) (Bilateral) (R>L) 05/23/2015  . Chronic pain syndrome 05/22/2015  . Chronic rhinitis 11/24/2018  . Chronic  sacroiliac joint pain (Bilateral) 05/23/2015  . Chronic shoulder pain (Right) 01/10/2016  . Class 2 obesity due to excess calories without serious comorbidity with body mass index (BMI) of 35.0 to 35.9 in adult 09/08/2017  . Depression   . Encounter for therapeutic drug level monitoring 05/22/2015  . Environmental allergies   . Epidural fibrosis 05/23/2015  .  Essential hypertension 05/23/2015  . 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   . 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 Fibromyalgia   . Generalized anxiety disorder 05/23/2015  . GERD (gastroesophageal reflux disease)   . Hiatal hernia   . History of goiter 09/08/2017   Formatting of this note might be different from the original. Surgery 2010  . History of seasonal allergies   . History of tobacco abuse 05/23/2015  . Hypertension   . Hypothyroidism   . IBS (irritable bowel syndrome)   . Irritable bowel syndrome 05/23/2015  . Irritable bowel syndrome with constipation 09/08/2017   Formatting of this note might be different from the original. linzess fails.  . 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 Long term current use of opiate analgesic 05/22/2015  . Long term prescription opiate use 05/22/2015  . Lumbar disc disease 09/08/2017   Formatting of this note might be different from the original. Multiple surgery. Follows with pain [employer].  . Lumbar facet hypertrophy 05/23/2015  . Lumbar facet syndrome (Bilateral) (L>R) 05/23/2015  . Lumbar Postlaminectomy Syndrome 05/22/2015  . Lumbar spondylosis 05/23/2015  . Menopausal and perimenopausal disorder 09/08/2017  . Mild episode of recurrent major depressive disorder ([place]) 09/08/2017  . Mixed hyperlipidemia 02/25/2018  . Muscle spasm 11/01/2015  . Musculoskeletal pain 05/23/2015  . Neurogenic pain 05/23/2015  . Neuropathic pain 05/23/2015  . Opiate dependence ([place]) 05/22/2015  . Opiate use (213.75 MME/Day) 05/22/2015  . Opioid-induced constipation ([facility]) 05/23/2015  . Osteoarthritis   . Osteoporosis 05/23/2015  . Peripheral edema 09/08/2017  . Prediabetes 09/22/2019  . Tobacco dependence 11/24/2018  . Vitamin B12 deficiency 05/23/2015  . Vitamin D insufficiency 05/23/2015    Past Surgical History:  Procedure  Laterality Date  . ABDOMINAL HYSTERECTOMY    . ANKLE SURGERY Right   . ANTERIOR CERVICAL DECOMP/DISCECTOMY FUSION N/A 11/10/2016   Procedure: Anterior Cervical Decompression Fusion  - Cervical three-Cervical four - Cervical four-Cervical five - Cervical five-Cervical six;  Surgeon: Earnie Larsson, MD;  Location: [facility];  Service: Neurosurgery;  Laterality: N/A;  . BACK SURGERY    . CHOLECYSTECTOMY    . COLONOSCOPY    . EYE SURGERY     bilateral cataracts  . HERNIA REPAIR     Abdomen  . NECK SURGERY    . SPINAL CORD STIMULATOR INSERTION N/A 06/05/2017   Procedure: LUMBAR SPINAL CORD STIMULATOR INSERTION;  Surgeon: Clydell Hakim, MD;  Location: [facility];  Service: Neurosurgery;  Laterality: N/A;  LUMBAR SPINAL CORD STIMULATOR INSERTION    Current Medications: Current Meds  Medication Sig  . cetirizine (ZYRTEC) 10 MG tablet Take 10 mg by mouth daily.  . Cyanocobalamin (B-12) 2500 MCG TABS Take 2,500 mcg by mouth daily.  . fluticasone (FLONASE) 50 MCG/ACT nasal spray Place 1 spray daily as needed into both nostrils for allergies or rhinitis.  . furosemide (LASIX) 20 MG tablet Take 20 mg by mouth daily as needed (for swelling).   . hydrochlorothiazide (MICROZIDE) 12.5 MG capsule  Take 12.5 mg by mouth every morning.  Marland Kitchen KLOR-CON M20 20 MEQ tablet Take 20 mEq by mouth daily.  . metoprolol succinate (TOPROL-XL) 25 MG 24 hr tablet Take 25 mg by mouth daily.   . Multiple Vitamin (MULTIVITAMIN) tablet Take 1 tablet by mouth daily.  Marland Kitchen OVER THE COUNTER MEDICATION Take 250 mg by mouth 2 (two) times daily as needed (pain). Hemp oil  . oxyCODONE-acetaminophen (PERCOCET) 10-325 MG tablet Take 1 tablet by mouth 3 (three) times daily.  . sertraline (ZOLOFT) 25 MG tablet Take 25 mg by mouth daily.  . travoprost, benzalkonium, (TRAVATAN) 0.004 % ophthalmic solution Place 1 drop into both eyes at bedtime as needed (for eye pressure--patient preference).      Allergies:   Prednisone   Social History    Socioeconomic History  . Marital status: Married    Spouse name: Not on file  . Number of children: Not on file  . Years of education: Not on file  . Highest education level: Not on file  Occupational History  . Not on file  Tobacco Use  . Smoking status: Current Every Day Smoker    Packs/day: 0.20    Years: 20.00    Pack years: 4.00  . Smokeless tobacco: Never Used  . Tobacco comment: 6 cigarettes per day.  Vaping Use  . Vaping Use: Never used  Substance and Sexual Activity  . Alcohol use: No    Alcohol/week: 0.0 standard drinks  . Drug use: No  . Sexual activity: Not on file  Other Topics Concern  . Not on file  Social History Narrative  . Not on file   Social Determinants of Health   Financial Resource Strain: Not on file  Food Insecurity: Not on file  Transportation Needs: Not on file  Physical Activity: Not on file  Stress: Not on file  Social Connections: Not on file     Family History: The patient's family history includes Cancer in her father and mother.  ROS:   Please see the history of present illness.    All other systems reviewed and are negative.  EKGs/Labs/Other Studies Reviewed:    The following studies were reviewed today: EKG reveals sinus rhythm and nonspecific ST-T changes   Recent Labs: No results found for requested labs within last 8760 hours.  Recent Lipid Panel No results found for: CHOL, TRIG, HDL, CHOLHDL, VLDL, LDLCALC, LDLDIRECT  Physical Exam:    VS:  BP 136/78   Pulse 70   Ht 5' 5.6" (1.666 m)   Wt 189 lb 9.6 oz (86 kg)   SpO2 98%   BMI 30.98 kg/m     Wt Readings from Last 3 Encounters:  11/28/20 189 lb 9.6 oz (86 kg)  06/05/17 190 lb (86.2 kg)  11/10/16 184 lb (83.5 kg)     GEN: Patient is in no acute distress HEENT: Normal NECK: No JVD; No carotid bruits LYMPHATICS: No lymphadenopathy CARDIAC: S1 S2 regular, 2/6 systolic murmur at the apex. RESPIRATORY:  Clear to auscultation without rales, wheezing or  rhonchi  ABDOMEN: Soft, non-tender, non-distended MUSCULOSKELETAL:  No edema; No deformity  SKIN: Warm and dry NEUROLOGIC:  Alert and oriented x 3 PSYCHIATRIC:  Normal affect    Signed, Lydia Lindau, MD  11/28/2020 3:29 PM     [facility]

## 2020-11-28 NOTE — Patient Instructions (Addendum)
Medication Instructions:  Your physician has recommended you make the following change in your medication:   Start Rosuvastatin 10 mg daily.  *If you need a refill on your cardiac medications before your next appointment, please call your pharmacy*   Lab Work: Your physician recommends that you have a BMET and LFT done today in the office. Your physician recommends that you return for lab work in: 6 weeks (01/09/21) You need to have labs done when you are fasting.  You can come Monday through Friday 8:30 am to 12:00 pm and 1:15 to 4:30. You do not need to make an appointment as the order has already been placed. The labs you are going to have done are BMET, LFT and Lipids.  If you have labs (blood work) drawn today and your tests are completely normal, you will receive your results only by: Marland Kitchen MyChart Message (if you have MyChart) OR . A paper copy in the mail If you have any lab test that is abnormal or we need to change your treatment, we will call you to review the results.   Testing/Procedures: Your physician has requested that you have a lexiscan myoview. For further information please visit [email or web address]. Please follow instruction sheet, as given.  The test will take approximately 3 to 4 hours to complete; you may bring reading material.  If someone comes with you to your appointment, they will need to remain in the main lobby due to limited space in the testing area.  How to prepare for your Myocardial Perfusion Test: . Do not eat or drink 3 hours prior to your test, except you may have water. . Do not consume products containing caffeine (regular or decaffeinated) 12 hours prior to your test. (ex: coffee, chocolate, sodas, tea). . Do bring a list of your current medications with you.  If not listed below, you may take your medications as normal. . Do wear comfortable clothes (no dresses or overalls) and walking shoes, tennis shoes preferred (No heels or open toe shoes are  allowed). . Do NOT wear cologne, perfume, aftershave, or lotions (deodorant is allowed). . If these instructions are not followed, your test will have to be rescheduled.  Your physician has requested that you have an echocardiogram. Echocardiography is a painless test that uses sound waves to create images of your heart. It provides your doctor with information about the size and shape of your heart and how well your heart's chambers and valves are working. This procedure takes approximately one hour. There are no restrictions for this procedure.    Follow-Up: At [facility], you and your health needs are our priority.  As part of our continuing mission to provide you with exceptional heart care, we have created designated Provider Care Teams.  These Care Teams include your primary Cardiologist (physician) and Advanced Practice Providers (APPs -  Physician Assistants and Nurse Practitioners) who all work together to provide you with the care you need, when you need it.  We recommend signing up for the patient portal called "MyChart".  Sign up information is provided on this After Visit Summary.  MyChart is used to connect with patients for Virtual Visits (Telemedicine).  Patients are able to view lab/test results, encounter notes, upcoming appointments, etc.  Non-urgent messages can be sent to your provider as well.   To learn more about what you can do with MyChart, go to [email or web address].    Your next appointment:   6 month(s)  The format for your next appointment:  In Person  Provider:   Jyl Heinz, MD   Other Instructions Cardiac Nuclear Scan A cardiac nuclear scan is a test that is done to check the flow of blood to your heart. It is done when you are resting and when you are exercising. The test looks for problems such as:  Not enough blood reaching a portion of the heart.  The heart muscle not working as it should. You may need this test if:  You have heart  disease.  You have had lab results that are not normal.  You have had heart surgery or a balloon procedure to open up blocked arteries (angioplasty).  You have chest pain.  You have shortness of breath. In this test, a special dye (tracer) is put into your bloodstream. The tracer will travel to your heart. A camera will then take pictures of your heart to see how the tracer moves through your heart. This test is usually done at a hospital and takes 2-4 hours. Tell a doctor about:  Any allergies you have.  All medicines you are taking, including vitamins, herbs, eye drops, creams, and over-the-counter medicines.  Any problems you or family members have had with anesthetic medicines.  Any blood disorders you have.  Any surgeries you have had.  Any medical conditions you have.  Whether you are pregnant or may be pregnant. What are the risks? Generally, this is a safe test. However, problems may occur, such as:  Serious chest pain and heart attack. This is only a risk if the stress portion of the test is done.  Rapid heartbeat.  A feeling of warmth in your chest. This feeling usually does not last long.  Allergic reaction to the tracer. What happens before the test?  Ask your doctor about changing or stopping your normal medicines. This is important.  Follow instructions from your doctor about what you cannot eat or drink.  Remove your jewelry on the day of the test. What happens during the test? 1. An IV tube will be inserted into one of your veins. 2. Your doctor will give you a small amount of tracer through the IV tube. 3. You will wait for 20-40 minutes while the tracer moves through your bloodstream. 4. Your heart will be monitored with an electrocardiogram (ECG). 5. You will lie down on an exam table. 6. Pictures of your heart will be taken for about 15-20 minutes. 7. You may also have a stress test. For this test, one of these things may be done: ? You will be  asked to exercise on a treadmill or a stationary bike. ? You will be given medicines that will make your heart work harder. This is done if you are unable to exercise. 8. When blood flow to your heart has peaked, a tracer will again be given through the IV tube. 9. After 20-40 minutes, you will get back on the exam table. More pictures will be taken of your heart. 10. Depending on the tracer that is used, more pictures may need to be taken 3-4 hours later. 11. Your IV tube will be removed when the test is over. The test may vary among doctors and hospitals. What happens after the test? 1. Ask your doctor: ? Whether you can return to your normal schedule, including diet, activities, and medicines. ? Whether you should drink more fluids. This will help to remove the tracer from your body. Drink enough fluid to keep your pee (urine) pale yellow. 2. Ask your doctor,  or the department that is doing the test: ? When will my results be ready? ? How will I get my results? Summary  A cardiac nuclear scan is a test that is done to check the flow of blood to your heart.  Tell your doctor whether you are pregnant or may be pregnant.  Before the test, ask your doctor about changing or stopping your normal medicines. This is important.  Ask your doctor whether you can return to your normal activities. You may be asked to drink more fluids. This information is not intended to replace advice given to you by your health care provider. Make sure you discuss any questions you have with your health care provider. Document Revised: 10/27/2018 Document Reviewed: 12/21/2017 Elsevier Patient Education  2021 [place].    Echocardiogram An echocardiogram is a test that uses sound waves (ultrasound) to produce images of the heart. Images from an echocardiogram can provide important information about:  Heart size and shape.  The size and thickness and movement of your heart's walls.  Heart muscle  function and strength.  Heart valve function or if you have stenosis. Stenosis is when the heart valves are too narrow.  If blood is flowing backward through the heart valves (regurgitation).  A tumor or infectious growth around the heart valves.  Areas of heart muscle that are not working well because of poor blood flow or injury from a heart attack.  Aneurysm detection. An aneurysm is a weak or damaged part of an artery wall. The wall bulges out from the normal force of blood pumping through the body. Tell a health care provider about:  Any allergies you have.  All medicines you are taking, including vitamins, herbs, eye drops, creams, and over-the-counter medicines.  Any blood disorders you have.  Any surgeries you have had.  Any medical conditions you have.  Whether you are pregnant or may be pregnant. What are the risks? Generally, this is a safe test. However, problems may occur, including an allergic reaction to dye (contrast) that may be used during the test. What happens before the test? No specific preparation is needed. You may eat and drink normally. What happens during the test?  You will take off your clothes from the waist up and put on a hospital gown.  Electrodes or electrocardiogram (ECG)patches may be placed on your chest. The electrodes or patches are then connected to a device that monitors your heart rate and rhythm.  You will lie down on a table for an ultrasound exam. A gel will be applied to your chest to help sound waves pass through your skin.  A handheld device, called a transducer, will be pressed against your chest and moved over your heart. The transducer produces sound waves that travel to your heart and bounce back (or "echo" back) to the transducer. These sound waves will be captured in real-time and changed into images of your heart that can be viewed on a video monitor. The images will be recorded on a computer and reviewed by your health care  provider.  You may be asked to change positions or hold your breath for a short time. This makes it easier to get different views or better views of your heart.  In some cases, you may receive contrast through an IV in one of your veins. This can improve the quality of the pictures from your heart. The procedure may vary among health care providers and hospitals.   What can I expect after  the test? You may return to your normal, everyday life, including diet, activities, and medicines, unless your health care provider tells you not to do that. Follow these instructions at home:  It is up to you to get the results of your test. Ask your health care provider, or the department that is doing the test, when your results will be ready.  Keep all follow-up visits. This is important. Summary  An echocardiogram is a test that uses sound waves (ultrasound) to produce images of the heart.  Images from an echocardiogram can provide important information about the size and shape of your heart, heart muscle function, heart valve function, and other possible heart problems.  You do not need to do anything to prepare before this test. You may eat and drink normally.  After the echocardiogram is completed, you may return to your normal, everyday life, unless your health care provider tells you not to do that. This information is not intended to replace advice given to you by your health care provider. Make sure you discuss any questions you have with your health care provider. Document Revised: 02/28/2020 Document Reviewed: 02/28/2020 Elsevier Patient Education  2021 [place].  Rosuvastatin Capsules What is this medicine? ROSUVASTATIN (roe SOO va sta tin) is known as an HMG-CoA reductase inhibitor or 'statin'. It lowers cholesterol and triglycerides in the blood. Diet and lifestyle changes are often used with this drug. This medicine may be used for other purposes; ask your health care provider or  pharmacist if you have questions. COMMON BRAND NAME(S): Ezallor What should I tell my health care provider before I take this medicine? They need to know if you have any of these conditions:  diabetes  if you often drink alcohol  history of stroke  kidney disease  liver disease  muscle aches or weakness  thyroid disease  an unusual or allergic reaction to rosuvastatin, other medicines, foods, dyes, or preservatives  pregnant or trying to get pregnant  breast-feeding How should I use this medicine? Take this medicine by mouth with a glass of water. Follow the directions on the prescription label. Swallow the capsules whole. Do not crush or chew this medicine. You may open the capsule and put the contents in 1 teaspoon of applesauce, chocolate pudding, or vanilla pudding. Swallow the medicine with applesauce or pudding within 60 minutes (1 hour). Do not chew the medicine, applesauce, or pudding. You can take this medicine with or without food. Take your doses at regular intervals. Do not take your medicine more often than directed. Talk to your pediatrician about the use of this medicine in children. Special care may be needed. Overdosage: If you think you have taken too much of this medicine contact a poison control center or emergency room at once. NOTE: This medicine is only for you. Do not share this medicine with others. What if I miss a dose? If you miss a dose, take it as soon as you can. If your next dose is to be taken in less than 12 hours, then do not take the missed dose. Take the next dose at your regular time. Do not take double or extra doses. What may interact with this medicine? Do not take this medicine with any of the following medications:  herbal medicines like red yeast rice This medicine may also interact with the following medications:  alcohol  antacids containing aluminum hydroxide or magnesium hydroxide  cyclosporine  other medicines for high  cholesterol  some medicines for HIV  infection  warfarin This list may not describe all possible interactions. Give your health care provider a list of all the medicines, herbs, non-prescription drugs, or dietary supplements you use. Also tell them if you smoke, drink alcohol, or use illegal drugs. Some items may interact with your medicine. What should I watch for while using this medicine? Visit your doctor or health care professional for regular check-ups. You may need regular tests to make sure your liver is working properly. Your health care professional may tell you to stop taking this medicine if you develop muscle problems. If your muscle problems do not go away after stopping this medicine, contact your health care professional. Do not become pregnant while taking this medicine. Women should inform their health care professional if they wish to become pregnant or think they might be pregnant. There is a potential for serious side effects to an unborn child. Talk to your health care professional or pharmacist for more information. Do not breast-feed an infant while taking this medicine. This medicine may increase blood sugar. Ask your healthcare provider if changes in diet or medicines are needed if you have diabetes. If you are going to need surgery or other procedure, tell your doctor that you are using this medicine. This drug is only part of a total heart-health program. Your doctor or a dietician can suggest a low-cholesterol and low-fat diet to help. Avoid alcohol and smoking, and keep a proper exercise schedule. This medicine may cause a decrease in Co-Enzyme Q-10. You should make sure that you get enough Co-Enzyme Q-10 while you are taking this medicine. Discuss the foods you eat and the vitamins you take with your health care professional. What side effects may I notice from receiving this medicine? Side effects that you should report to your doctor or health care professional as soon  as possible:  allergic reactions like skin rash, itching or hives, swelling of the face, lips, or tongue  dark urine  fever  joint pain  muscle cramps, pain  redness, blistering, peeling or loosening of the skin, including inside the mouth  signs and symptoms of high blood sugar such as being more thirsty or hungry or having to urinate more than normal. You may also feel very tired or have blurry vision.  trouble passing urine or change in the amount of urine  unusually weak  yellowing of the eyes or skin Side effects that usually do not require medical attention (report these to your doctor or health care professional if they continue or are bothersome):  constipation  heartburn  nausea  stomach gas, pain, upset This list may not describe all possible side effects. Call your doctor for medical advice about side effects. You may report side effects to FDA at 1-800-FDA-1088. Where should I keep my medicine? Keep out of the reach of children. Store at room temperature between 20 and 25 degrees C (68 and 77 degrees F). Keep container tightly closed (protect from moisture). Throw away any unused medicine after the expiration date. NOTE: This sheet is a summary. It may not cover all possible information. If you have questions about this medicine, talk to your doctor, pharmacist, or health care provider.  2021 Elsevier/Gold Standard (2020-05-20 09:54:10)

## 2020-11-28 NOTE — Addendum Note (Signed)
Addended by: Truddie Hidden on: 11/28/2020 04:23 PM   Modules accepted: Orders

## 2020-11-29 ENCOUNTER — Telehealth (HOSPITAL_COMMUNITY): Payer: Self-pay | Admitting: *Deleted

## 2020-11-29 ENCOUNTER — Encounter (HOSPITAL_COMMUNITY): Payer: Self-pay | Admitting: *Deleted

## 2020-11-29 LAB — LIPID PANEL
Chol/HDL Ratio: 3.1 ratio (ref 0.0–4.4)
Cholesterol, Total: 224 mg/dL — ABNORMAL HIGH (ref 100–199)
HDL: 72 mg/dL (ref >39)
LDL Chol Calc (NIH): 133 mg/dL — ABNORMAL HIGH (ref 0–99)
Triglycerides: 109 mg/dL (ref 0–149)
VLDL Cholesterol Cal: 19 mg/dL (ref 5–40)

## 2020-11-29 LAB — BASIC METABOLIC PANEL
BUN/Creatinine Ratio: 16 (ref 12–28)
BUN: 13 mg/dL (ref 8–27)
CO2: 25 mmol/L (ref 20–29)
Calcium: 10 mg/dL (ref 8.7–10.3)
Chloride: 106 mmol/L (ref 96–106)
Creatinine, Ser: 0.79 mg/dL (ref 0.57–1.00)
Glucose: 101 mg/dL — ABNORMAL HIGH (ref 65–99)
Potassium: 3.8 mmol/L (ref 3.5–5.2)
Sodium: 143 mmol/L (ref 134–144)
eGFR: 82 mL/min/{1.73_m2} (ref >59)

## 2020-11-29 NOTE — Telephone Encounter (Signed)
Letter sent outlining instructions for stress test on 12/05/20 at 11:00.

## 2020-12-04 NOTE — Addendum Note (Signed)
Addended by: Truddie Hidden on: 12/04/2020 11:48 AM   Modules accepted: Orders

## 2020-12-05 ENCOUNTER — Other Ambulatory Visit: Payer: Self-pay

## 2020-12-05 ENCOUNTER — Ambulatory Visit (INDEPENDENT_AMBULATORY_CARE_PROVIDER_SITE_OTHER): Payer: Medicare Other

## 2020-12-05 DIAGNOSIS — E782 Mixed hyperlipidemia: Secondary | ICD-10-CM

## 2020-12-05 DIAGNOSIS — I714 Abdominal aortic aneurysm, without rupture, unspecified: Secondary | ICD-10-CM

## 2020-12-05 DIAGNOSIS — I10 Essential (primary) hypertension: Secondary | ICD-10-CM | POA: Diagnosis not present

## 2020-12-05 DIAGNOSIS — I2510 Atherosclerotic heart disease of native coronary artery without angina pectoris: Secondary | ICD-10-CM

## 2020-12-05 DIAGNOSIS — F17200 Nicotine dependence, unspecified, uncomplicated: Secondary | ICD-10-CM | POA: Diagnosis not present

## 2020-12-05 LAB — MYOCARDIAL PERFUSION IMAGING
LV dias vol: 55 mL (ref 46–106)
LV sys vol: 18 mL
Peak HR: 77 {beats}/min
Rest HR: 63 {beats}/min
SDS: 3
SRS: 5
SSS: 8
TID: 1.12

## 2020-12-05 MED ORDER — TECHNETIUM TC 99M TETROFOSMIN IV KIT
30.1000 | PACK | Freq: Once | INTRAVENOUS | Status: AC | PRN
  Administered 2020-12-05: 30.1 via INTRAVENOUS

## 2020-12-05 MED ORDER — REGADENOSON 0.4 MG/5ML IV SOLN
0.4000 mg | Freq: Once | INTRAVENOUS | Status: AC
  Administered 2020-12-05: 0.4 mg via INTRAVENOUS

## 2020-12-05 MED ORDER — TECHNETIUM TC 99M TETROFOSMIN IV KIT
10.1000 | PACK | Freq: Once | INTRAVENOUS | Status: AC | PRN
  Administered 2020-12-05: 10.1 via INTRAVENOUS

## 2020-12-06 ENCOUNTER — Telehealth: Payer: Self-pay

## 2020-12-06 LAB — HEPATIC FUNCTION PANEL
ALT: 15 IU/L (ref 0–32)
AST: 22 IU/L (ref 0–40)
Albumin: 4.3 g/dL (ref 3.8–4.8)
Alkaline Phosphatase: 63 IU/L (ref 44–121)
Bilirubin Total: 0.3 mg/dL (ref 0.0–1.2)
Bilirubin, Direct: 0.12 mg/dL (ref 0.00–0.40)
Total Protein: 7.1 g/dL (ref 6.0–8.5)

## 2020-12-06 NOTE — Telephone Encounter (Signed)
-----   Message from [name], MD sent at 12/06/2020 10:39 AM EDT ----- The results of the study is unremarkable. Please inform patient. I will discuss in detail at next appointment. Cc  primary care/referring physician [name], MD 12/06/2020 10:39 AM

## 2020-12-06 NOTE — Telephone Encounter (Signed)
Left message on patients voicemail to please return our call.   

## 2020-12-21 ENCOUNTER — Ambulatory Visit (INDEPENDENT_AMBULATORY_CARE_PROVIDER_SITE_OTHER): Payer: Medicare Other

## 2020-12-21 ENCOUNTER — Other Ambulatory Visit: Payer: Self-pay

## 2020-12-21 DIAGNOSIS — R011 Cardiac murmur, unspecified: Secondary | ICD-10-CM

## 2020-12-21 LAB — ECHOCARDIOGRAM COMPLETE
Area-P 1/2: 3.31 cm2
S' Lateral: 2.3 cm

## 2020-12-21 NOTE — Progress Notes (Signed)
Complete echocardiogram performed.  Jimmy Shailey Butterbaugh RDCS, RVT  

## 2021-06-12 ENCOUNTER — Encounter: Payer: Self-pay | Admitting: Cardiology

## 2021-06-12 ENCOUNTER — Ambulatory Visit: Payer: Medicare Other | Admitting: Cardiology

## 2021-11-27 ENCOUNTER — Other Ambulatory Visit: Payer: Self-pay | Admitting: Cardiology

## 2021-11-27 DIAGNOSIS — I10 Essential (primary) hypertension: Secondary | ICD-10-CM

## 2021-12-27 ENCOUNTER — Other Ambulatory Visit: Payer: Self-pay | Admitting: Cardiology

## 2021-12-27 DIAGNOSIS — I10 Essential (primary) hypertension: Secondary | ICD-10-CM

## 2022-01-16 ENCOUNTER — Other Ambulatory Visit: Payer: Self-pay | Admitting: Cardiology

## 2022-01-16 DIAGNOSIS — I10 Essential (primary) hypertension: Secondary | ICD-10-CM

## 2022-01-30 ENCOUNTER — Other Ambulatory Visit: Payer: Self-pay | Admitting: Cardiology

## 2022-01-30 DIAGNOSIS — I10 Essential (primary) hypertension: Secondary | ICD-10-CM

## 2022-08-25 ENCOUNTER — Other Ambulatory Visit: Payer: Self-pay | Admitting: Pain Medicine
